# Patient Record
Sex: Male | Born: 1965
Health system: Southern US, Community
[De-identification: ages and names within clinical notes are randomized; demographics above are authoritative.]

## PROBLEM LIST (undated history)

## (undated) DIAGNOSIS — G4730 Sleep apnea, unspecified: Secondary | ICD-10-CM

## (undated) HISTORY — PX: NASAL SINUS SURGERY: SHX719

## (undated) HISTORY — PX: CARPAL TUNNEL RELEASE: SHX101

## (undated) HISTORY — PX: NASAL SEPTUM SURGERY: SHX37

## (undated) HISTORY — PX: LAMINECTOMY: SHX219

## (undated) HISTORY — PX: NECK SURGERY: SHX720

---

## 1999-03-29 ENCOUNTER — Encounter: Admission: RE | Admit: 1999-03-29 | Discharge: 1999-06-27 | Payer: Self-pay | Admitting: Family Medicine

## 1999-05-10 ENCOUNTER — Emergency Department (HOSPITAL_COMMUNITY): Admission: EM | Admit: 1999-05-10 | Discharge: 1999-05-10 | Payer: Self-pay | Admitting: Emergency Medicine

## 1999-05-12 ENCOUNTER — Encounter: Admission: RE | Admit: 1999-05-12 | Discharge: 1999-05-12 | Payer: Self-pay | Admitting: Specialist

## 1999-05-12 ENCOUNTER — Encounter: Payer: Self-pay | Admitting: Specialist

## 1999-05-18 ENCOUNTER — Observation Stay (HOSPITAL_COMMUNITY): Admission: RE | Admit: 1999-05-18 | Discharge: 1999-05-19 | Payer: Self-pay | Admitting: Specialist

## 1999-05-18 ENCOUNTER — Encounter: Payer: Self-pay | Admitting: Specialist

## 2000-03-23 ENCOUNTER — Ambulatory Visit (HOSPITAL_COMMUNITY): Admission: RE | Admit: 2000-03-23 | Discharge: 2000-03-23 | Payer: Self-pay | Admitting: Specialist

## 2000-03-23 ENCOUNTER — Encounter: Payer: Self-pay | Admitting: Specialist

## 2000-12-27 ENCOUNTER — Encounter: Payer: Self-pay | Admitting: Specialist

## 2000-12-27 ENCOUNTER — Encounter: Admission: RE | Admit: 2000-12-27 | Discharge: 2000-12-27 | Payer: Self-pay | Admitting: Specialist

## 2001-02-21 ENCOUNTER — Inpatient Hospital Stay (HOSPITAL_COMMUNITY): Admission: RE | Admit: 2001-02-21 | Discharge: 2001-02-23 | Payer: Self-pay | Admitting: Specialist

## 2001-02-21 ENCOUNTER — Encounter: Payer: Self-pay | Admitting: Specialist

## 2001-07-09 ENCOUNTER — Encounter: Payer: Self-pay | Admitting: Emergency Medicine

## 2001-07-09 ENCOUNTER — Emergency Department (HOSPITAL_COMMUNITY): Admission: EM | Admit: 2001-07-09 | Discharge: 2001-07-09 | Payer: Self-pay | Admitting: Emergency Medicine

## 2006-05-19 ENCOUNTER — Ambulatory Visit (HOSPITAL_COMMUNITY): Admission: RE | Admit: 2006-05-19 | Discharge: 2006-05-20 | Payer: Self-pay | Admitting: Neurosurgery

## 2015-04-22 DIAGNOSIS — E782 Mixed hyperlipidemia: Secondary | ICD-10-CM | POA: Diagnosis not present

## 2015-04-22 DIAGNOSIS — Z0001 Encounter for general adult medical examination with abnormal findings: Secondary | ICD-10-CM | POA: Diagnosis not present

## 2015-04-27 DIAGNOSIS — Z0001 Encounter for general adult medical examination with abnormal findings: Secondary | ICD-10-CM | POA: Diagnosis not present

## 2015-04-27 DIAGNOSIS — Z6834 Body mass index (BMI) 34.0-34.9, adult: Secondary | ICD-10-CM | POA: Diagnosis not present

## 2015-05-29 DIAGNOSIS — M159 Polyosteoarthritis, unspecified: Secondary | ICD-10-CM | POA: Diagnosis not present

## 2015-05-29 DIAGNOSIS — E669 Obesity, unspecified: Secondary | ICD-10-CM | POA: Diagnosis not present

## 2015-06-15 DIAGNOSIS — Z6834 Body mass index (BMI) 34.0-34.9, adult: Secondary | ICD-10-CM | POA: Diagnosis not present

## 2015-06-15 DIAGNOSIS — H6503 Acute serous otitis media, bilateral: Secondary | ICD-10-CM | POA: Diagnosis not present

## 2015-06-15 DIAGNOSIS — J0100 Acute maxillary sinusitis, unspecified: Secondary | ICD-10-CM | POA: Diagnosis not present

## 2015-07-01 DIAGNOSIS — R2232 Localized swelling, mass and lump, left upper limb: Secondary | ICD-10-CM | POA: Diagnosis not present

## 2015-07-01 DIAGNOSIS — G5613 Other lesions of median nerve, bilateral upper limbs: Secondary | ICD-10-CM | POA: Diagnosis not present

## 2015-07-02 DIAGNOSIS — Z0189 Encounter for other specified special examinations: Secondary | ICD-10-CM | POA: Diagnosis not present

## 2015-07-02 DIAGNOSIS — Z0001 Encounter for general adult medical examination with abnormal findings: Secondary | ICD-10-CM | POA: Diagnosis not present

## 2015-07-02 DIAGNOSIS — J40 Bronchitis, not specified as acute or chronic: Secondary | ICD-10-CM | POA: Diagnosis not present

## 2015-07-27 DIAGNOSIS — H401130 Primary open-angle glaucoma, bilateral, stage unspecified: Secondary | ICD-10-CM | POA: Diagnosis not present

## 2015-11-04 DIAGNOSIS — Z01812 Encounter for preprocedural laboratory examination: Secondary | ICD-10-CM | POA: Diagnosis not present

## 2015-11-04 DIAGNOSIS — Z01818 Encounter for other preprocedural examination: Secondary | ICD-10-CM | POA: Diagnosis not present

## 2015-11-04 DIAGNOSIS — G5603 Carpal tunnel syndrome, bilateral upper limbs: Secondary | ICD-10-CM | POA: Diagnosis not present

## 2015-11-05 DIAGNOSIS — G5612 Other lesions of median nerve, left upper limb: Secondary | ICD-10-CM | POA: Diagnosis not present

## 2015-11-05 DIAGNOSIS — G5602 Carpal tunnel syndrome, left upper limb: Secondary | ICD-10-CM | POA: Diagnosis not present

## 2016-01-11 DIAGNOSIS — I2699 Other pulmonary embolism without acute cor pulmonale: Secondary | ICD-10-CM

## 2016-01-11 HISTORY — DX: Other pulmonary embolism without acute cor pulmonale: I26.99

## 2016-01-21 DIAGNOSIS — H401130 Primary open-angle glaucoma, bilateral, stage unspecified: Secondary | ICD-10-CM | POA: Diagnosis not present

## 2016-01-21 DIAGNOSIS — H401131 Primary open-angle glaucoma, bilateral, mild stage: Secondary | ICD-10-CM | POA: Diagnosis not present

## 2016-03-19 ENCOUNTER — Emergency Department (HOSPITAL_COMMUNITY)
Admission: EM | Admit: 2016-03-19 | Discharge: 2016-03-19 | Disposition: A | Discharge status: Home / Self Care | Payer: Federal, State, Local not specified - PPO | Attending: Emergency Medicine | Admitting: Emergency Medicine

## 2016-03-19 ENCOUNTER — Emergency Department (HOSPITAL_COMMUNITY): Payer: Federal, State, Local not specified - PPO

## 2016-03-19 ENCOUNTER — Encounter (HOSPITAL_COMMUNITY): Payer: Self-pay | Admitting: Emergency Medicine

## 2016-03-19 DIAGNOSIS — M545 Low back pain: Secondary | ICD-10-CM | POA: Diagnosis not present

## 2016-03-19 DIAGNOSIS — Z6835 Body mass index (BMI) 35.0-35.9, adult: Secondary | ICD-10-CM | POA: Diagnosis not present

## 2016-03-19 DIAGNOSIS — Z87891 Personal history of nicotine dependence: Secondary | ICD-10-CM | POA: Diagnosis not present

## 2016-03-19 DIAGNOSIS — M5417 Radiculopathy, lumbosacral region: Secondary | ICD-10-CM | POA: Diagnosis not present

## 2016-03-19 DIAGNOSIS — M5442 Lumbago with sciatica, left side: Secondary | ICD-10-CM | POA: Diagnosis not present

## 2016-03-19 DIAGNOSIS — M549 Dorsalgia, unspecified: Secondary | ICD-10-CM

## 2016-03-19 DIAGNOSIS — G834 Cauda equina syndrome: Secondary | ICD-10-CM | POA: Diagnosis not present

## 2016-03-19 LAB — URINALYSIS, ROUTINE W REFLEX MICROSCOPIC
BILIRUBIN URINE: NEGATIVE
Glucose, UA: NEGATIVE mg/dL
Hgb urine dipstick: NEGATIVE
KETONES UR: NEGATIVE mg/dL
LEUKOCYTES UA: NEGATIVE
NITRITE: NEGATIVE
Protein, ur: NEGATIVE mg/dL
SPECIFIC GRAVITY, URINE: 1.013 (ref 1.005–1.030)
pH: 6 (ref 5.0–8.0)

## 2016-03-19 LAB — I-STAT CHEM 8, ED
BUN: 14 mg/dL (ref 6–20)
CALCIUM ION: 1.12 mmol/L — AB (ref 1.15–1.40)
CHLORIDE: 103 mmol/L (ref 101–111)
Creatinine, Ser: 0.9 mg/dL (ref 0.61–1.24)
Glucose, Bld: 90 mg/dL (ref 65–99)
HEMATOCRIT: 46 % (ref 39.0–52.0)
Hemoglobin: 15.6 g/dL (ref 13.0–17.0)
Potassium: 4.4 mmol/L (ref 3.5–5.1)
SODIUM: 138 mmol/L (ref 135–145)
TCO2: 26 mmol/L (ref 0–100)

## 2016-03-19 MED ORDER — METHOCARBAMOL 500 MG PO TABS
500.0000 mg | ORAL_TABLET | Freq: Once | ORAL | Status: AC
  Administered 2016-03-19: 500 mg via ORAL
  Filled 2016-03-19: qty 1

## 2016-03-19 MED ORDER — GADOBENATE DIMEGLUMINE 529 MG/ML IV SOLN
20.0000 mL | Freq: Once | INTRAVENOUS | Status: AC | PRN
  Administered 2016-03-19: 20 mL via INTRAVENOUS

## 2016-03-19 MED ORDER — SODIUM CHLORIDE 0.9 % IV BOLUS (SEPSIS)
1000.0000 mL | Freq: Once | INTRAVENOUS | Status: AC
  Administered 2016-03-19: 1000 mL via INTRAVENOUS

## 2016-03-19 MED ORDER — HYDROMORPHONE HCL 1 MG/ML IJ SOLN
1.0000 mg | Freq: Once | INTRAMUSCULAR | Status: AC
  Administered 2016-03-19: 1 mg via INTRAVENOUS
  Filled 2016-03-19: qty 1

## 2016-03-19 NOTE — ED Notes (Signed)
Patient transported to MRI 

## 2016-03-19 NOTE — ED Triage Notes (Signed)
Patient c/o lower back pain x10 days with intermittent numbness in the left leg and difficulty urinating. Reports he was seen at PCP this morning who referred patient to be evaluated here and get an MRI. Patient ambulatory to triage.

## 2016-03-19 NOTE — ED Provider Notes (Signed)
[facility]-EMERGENCY DEPT Provider Note   CSN: [id number] Arrival date & time: 03/19/16  1323     History   Chief Complaint Chief Complaint  Patient presents with  . Back Pain    HPI Samuel Peterson is a 51 y.o. male.  51 year old male with history of multiple back surgeries presents to the emergency department with about 10 days of progressively worsening midline lumbar back pain seems to radiate to the left side of his back done outside of his left leg and down to his toe. He's had similar symptoms before sciatica flares but usually this was waiting to 3 days. He's also had some increased urinary frequency with decreased urinary output. He has had some constipation but relates this to the narcotic pain medicine he takes an seems that its normal for him. He's had weakness in both legs and some numbness down his left leg. He relayed this information to his primary doctor this morning when he was there for sinus issues and they recommended he come here for further evaluation. He has not had any fevers, chills, dysuria, rectal pain, saddle anesthesia or difficulty walking. No nausea vomiting or diarrhea. He says that he has taken muscle relaxers and narcotic pain medicine without any significant relief. He also states he has used some heat helps some with the lower back pain but not with the other symptoms.      History reviewed. No pertinent past medical history.  There are no active problems to display for this patient.   Past Surgical History:  Procedure Laterality Date  . CARPAL TUNNEL RELEASE    . LAMINECTOMY    . NASAL SEPTUM SURGERY    . NASAL SINUS SURGERY    . NECK SURGERY         Home Medications    Prior to Admission medications   Medication Sig Start Date End Date Taking? Authorizing Provider  acetaminophen (TYLENOL) 500 MG tablet Take 1,500-2,000 mg by mouth every 6 (six) hours as needed (pain).   Yes Historical Provider, MD  ibuprofen (ADVIL,MOTRIN) 200 MG tablet  Take 600-800 mg by mouth every 6 (six) hours as needed (pain).   Yes Historical Provider, MD  latanoprost (XALATAN) 0.005 % ophthalmic solution Place 1 drop into both eyes at bedtime. 01/29/16  Yes Historical Provider, MD    Family History History reviewed. No pertinent family history.  Social History Social History  Substance Use Topics  . Smoking status: Former Games developer  . Smokeless tobacco: Never Used  . Alcohol use Not on file     Allergies   Patient has no known allergies.   Review of Systems Review of Systems  All other systems reviewed and are negative.    Physical Exam Updated Vital Signs BP 122/78 (BP Location: Left Arm)   Pulse 100   Temp 98.1 F (36.7 C) (Oral)   Resp 20   Ht 5\' 9"  (1.753 m)   Wt 248 lb (112.5 kg)   SpO2 95%   BMI 36.62 kg/m   Physical Exam  Constitutional: He is oriented to person, place, and time. He appears well-developed and well-nourished.  HENT:  Head: Normocephalic and atraumatic.  Eyes: Conjunctivae and EOM are normal.  Neck: Normal range of motion.  Cardiovascular: Normal rate.   Pulmonary/Chest: Effort normal. No respiratory distress.  Abdominal: Soft. He exhibits no distension.  Musculoskeletal: Normal range of motion.  Neurological: He is alert and oriented to person, place, and time. No cranial nerve deficit. Coordination normal.  No  altered mental status, able to give full seemingly accurate history.  Face is symmetric, EOM's intact, pupils equal and reactive, vision intact, tongue and uvula midline without deviation Upper and Lower extremity motor 4+/5 (seems related to pain), intact pain perception in distal upper extremities but has decreased sensation to light touch in left lateral leg, 2+ reflexes patella and achilles tendons. Finger to nose normal, heel to shin normal. Walks without assistance or evident ataxia.    Skin: Skin is warm and dry.  Nursing note and vitals reviewed.    ED Treatments / Results   Labs (all labs ordered are listed, but only abnormal results are displayed) Labs Reviewed  I-STAT CHEM 8, ED - Abnormal; Notable for the following:       Result Value   Calcium, Ion 1.12 (*)    All other components within normal limits  URINALYSIS, ROUTINE W REFLEX MICROSCOPIC    EKG  EKG Interpretation None       Radiology Mr Lumbar Spine W Wo Contrast  Result Date: 03/19/2016 CLINICAL DATA:  Back pain. EXAM: MRI LUMBAR SPINE WITHOUT AND WITH CONTRAST TECHNIQUE: Multiplanar and multiecho pulse sequences of the lumbar spine were obtained without and with intravenous contrast. CONTRAST:  20mL MULTIHANCE GADOBENATE DIMEGLUMINE 529 MG/ML IV SOLN COMPARISON:  None. FINDINGS: Segmentation:  Standard. Alignment:  Physiologic. Vertebrae: No fracture, evidence of discitis, or bone lesion. Mild osteoarthritis of bilateral SI joints. Conus medullaris: Extends to the T12 level and appears normal. Paraspinal and other soft tissues: Negative. Disc levels: Disc spaces: Degenerative disc disease with disc height loss at L4-5. Laminectomy at L4-5. T11-12: Mild broad-based disc bulge. No foraminal or central canal stenosis. Mild bilateral facet arthropathy. T12-L1: Mild broad-based disc bulge eccentric towards the right. No evidence of neural foraminal stenosis. No central canal stenosis. L1-L2: No significant disc bulge. No evidence of neural foraminal stenosis. No central canal stenosis. L2-L3: No significant disc bulge. No evidence of neural foraminal stenosis. No central canal stenosis. L3-L4: Broad-based disc bulge with a central disc protrusion. Bilateral lateral recess narrowing. Mild bilateral facet arthropathy. No evidence of neural foraminal stenosis. No central canal stenosis. L4-L5: Broad-based disc bulge. Moderate bilateral facet arthropathy. Mild bilateral foraminal stenosis. No central canal stenosis. L5-S1: No significant disc bulge. No evidence of neural foraminal stenosis. No central canal  stenosis. Mild bilateral facet arthropathy. IMPRESSION: 1. At L3-4 there is a broad-based disc bulge with a central disc protrusion. Bilateral lateral recess narrowing. Mild bilateral facet arthropathy. 2. At L4-5 there is a broad-based disc bulge. Moderate bilateral facet arthropathy. Mild bilateral foraminal stenosis. Electronically Signed   By: Elige Ko   On: 03/19/2016 16:58    Procedures Procedures (including critical care time)  Medications Ordered in ED Medications  HYDROmorphone (DILAUDID) injection 1 mg (1 mg Intravenous Given 03/19/16 1430)  methocarbamol (ROBAXIN) tablet 500 mg (500 mg Oral Given 03/19/16 1430)  sodium chloride 0.9 % bolus 1,000 mL (0 mLs Intravenous Stopped 03/19/16 1740)  gadobenate dimeglumine (MULTIHANCE) injection 20 mL (20 mLs Intravenous Contrast Given 03/19/16 1639)     Initial Impression / Assessment and Plan / ED Course  I have reviewed the triage vital signs and the nursing notes.  Pertinent labs & imaging results that were available during my care of the patient were reviewed by me and considered in my medical decision making (see chart for details).    Likely muscular back pain with a component of sciatica however with the new urinary and neuro symptoms and the  prolonged course of his symptoms there is a higher than normal probability of cord compression, so I'll do an MRI to make sure it is ok. We'll also check for urinary tract infection and also treat him for muscular spasm.  Has some disc protrusion and foraminal narrowing consistent with symptoms. No cord compression. Stable for dc with pcp and surgery follow up.   Final Clinical Impressions(s) / ED Diagnoses   Final diagnoses:  Back pain  Acute left-sided low back pain with left-sided sciatica      Marily MemosJason Jereme Loren, MD 03/19/16 1806

## 2016-04-25 DIAGNOSIS — Z0001 Encounter for general adult medical examination with abnormal findings: Secondary | ICD-10-CM | POA: Diagnosis not present

## 2016-04-27 DIAGNOSIS — Z1211 Encounter for screening for malignant neoplasm of colon: Secondary | ICD-10-CM | POA: Diagnosis not present

## 2016-04-27 DIAGNOSIS — K219 Gastro-esophageal reflux disease without esophagitis: Secondary | ICD-10-CM | POA: Diagnosis not present

## 2016-04-28 DIAGNOSIS — Z23 Encounter for immunization: Secondary | ICD-10-CM | POA: Diagnosis not present

## 2016-04-28 DIAGNOSIS — Z0001 Encounter for general adult medical examination with abnormal findings: Secondary | ICD-10-CM | POA: Diagnosis not present

## 2016-04-28 DIAGNOSIS — Z6834 Body mass index (BMI) 34.0-34.9, adult: Secondary | ICD-10-CM | POA: Diagnosis not present

## 2016-05-02 DIAGNOSIS — R0602 Shortness of breath: Secondary | ICD-10-CM | POA: Diagnosis not present

## 2016-05-02 DIAGNOSIS — R05 Cough: Secondary | ICD-10-CM | POA: Diagnosis not present

## 2016-05-02 DIAGNOSIS — I2699 Other pulmonary embolism without acute cor pulmonale: Secondary | ICD-10-CM | POA: Diagnosis not present

## 2016-05-02 DIAGNOSIS — R509 Fever, unspecified: Secondary | ICD-10-CM | POA: Diagnosis not present

## 2016-05-02 DIAGNOSIS — Z6835 Body mass index (BMI) 35.0-35.9, adult: Secondary | ICD-10-CM | POA: Diagnosis not present

## 2016-05-10 DIAGNOSIS — Z86711 Personal history of pulmonary embolism: Secondary | ICD-10-CM | POA: Diagnosis not present

## 2016-05-10 DIAGNOSIS — K219 Gastro-esophageal reflux disease without esophagitis: Secondary | ICD-10-CM | POA: Diagnosis not present

## 2016-05-10 DIAGNOSIS — E6609 Other obesity due to excess calories: Secondary | ICD-10-CM | POA: Diagnosis not present

## 2016-05-10 DIAGNOSIS — R0602 Shortness of breath: Secondary | ICD-10-CM | POA: Diagnosis not present

## 2016-05-11 DIAGNOSIS — Z86711 Personal history of pulmonary embolism: Secondary | ICD-10-CM | POA: Diagnosis not present

## 2016-05-23 DIAGNOSIS — H401131 Primary open-angle glaucoma, bilateral, mild stage: Secondary | ICD-10-CM | POA: Diagnosis not present

## 2016-06-08 DIAGNOSIS — Z1211 Encounter for screening for malignant neoplasm of colon: Secondary | ICD-10-CM | POA: Diagnosis not present

## 2016-06-08 DIAGNOSIS — Z7901 Long term (current) use of anticoagulants: Secondary | ICD-10-CM | POA: Diagnosis not present

## 2016-06-08 DIAGNOSIS — R635 Abnormal weight gain: Secondary | ICD-10-CM | POA: Diagnosis not present

## 2016-06-10 DIAGNOSIS — E6609 Other obesity due to excess calories: Secondary | ICD-10-CM | POA: Diagnosis not present

## 2016-06-10 DIAGNOSIS — K219 Gastro-esophageal reflux disease without esophagitis: Secondary | ICD-10-CM | POA: Diagnosis not present

## 2016-06-10 DIAGNOSIS — R0602 Shortness of breath: Secondary | ICD-10-CM | POA: Diagnosis not present

## 2016-06-10 DIAGNOSIS — Z86711 Personal history of pulmonary embolism: Secondary | ICD-10-CM | POA: Diagnosis not present

## 2016-06-29 DIAGNOSIS — Z23 Encounter for immunization: Secondary | ICD-10-CM | POA: Diagnosis not present

## 2016-07-01 DIAGNOSIS — I2699 Other pulmonary embolism without acute cor pulmonale: Secondary | ICD-10-CM | POA: Diagnosis not present

## 2016-07-01 DIAGNOSIS — D6862 Lupus anticoagulant syndrome: Secondary | ICD-10-CM | POA: Diagnosis not present

## 2016-07-01 DIAGNOSIS — Z7901 Long term (current) use of anticoagulants: Secondary | ICD-10-CM | POA: Diagnosis not present

## 2016-08-11 DIAGNOSIS — Z7901 Long term (current) use of anticoagulants: Secondary | ICD-10-CM | POA: Diagnosis not present

## 2016-08-11 DIAGNOSIS — D6862 Lupus anticoagulant syndrome: Secondary | ICD-10-CM | POA: Diagnosis not present

## 2016-08-11 DIAGNOSIS — I2699 Other pulmonary embolism without acute cor pulmonale: Secondary | ICD-10-CM | POA: Diagnosis not present

## 2016-09-14 DIAGNOSIS — I2699 Other pulmonary embolism without acute cor pulmonale: Secondary | ICD-10-CM | POA: Diagnosis not present

## 2016-09-16 DIAGNOSIS — Z23 Encounter for immunization: Secondary | ICD-10-CM | POA: Diagnosis not present

## 2016-09-28 DIAGNOSIS — M79632 Pain in left forearm: Secondary | ICD-10-CM | POA: Diagnosis not present

## 2016-09-28 DIAGNOSIS — G5602 Carpal tunnel syndrome, left upper limb: Secondary | ICD-10-CM | POA: Diagnosis not present

## 2016-09-28 DIAGNOSIS — M25522 Pain in left elbow: Secondary | ICD-10-CM | POA: Diagnosis not present

## 2016-09-28 DIAGNOSIS — M25532 Pain in left wrist: Secondary | ICD-10-CM | POA: Diagnosis not present

## 2016-10-20 DIAGNOSIS — Z86711 Personal history of pulmonary embolism: Secondary | ICD-10-CM | POA: Diagnosis not present

## 2016-10-20 DIAGNOSIS — D6862 Lupus anticoagulant syndrome: Secondary | ICD-10-CM | POA: Diagnosis not present

## 2016-10-20 DIAGNOSIS — K219 Gastro-esophageal reflux disease without esophagitis: Secondary | ICD-10-CM | POA: Diagnosis not present

## 2016-10-20 DIAGNOSIS — E782 Mixed hyperlipidemia: Secondary | ICD-10-CM | POA: Diagnosis not present

## 2016-10-27 DIAGNOSIS — E6609 Other obesity due to excess calories: Secondary | ICD-10-CM | POA: Diagnosis not present

## 2016-10-27 DIAGNOSIS — D6862 Lupus anticoagulant syndrome: Secondary | ICD-10-CM | POA: Diagnosis not present

## 2016-10-27 DIAGNOSIS — Z23 Encounter for immunization: Secondary | ICD-10-CM | POA: Diagnosis not present

## 2016-10-27 DIAGNOSIS — K219 Gastro-esophageal reflux disease without esophagitis: Secondary | ICD-10-CM | POA: Diagnosis not present

## 2016-10-27 DIAGNOSIS — Z86711 Personal history of pulmonary embolism: Secondary | ICD-10-CM | POA: Diagnosis not present

## 2016-11-09 DIAGNOSIS — Z7901 Long term (current) use of anticoagulants: Secondary | ICD-10-CM | POA: Diagnosis not present

## 2016-11-09 DIAGNOSIS — Z1211 Encounter for screening for malignant neoplasm of colon: Secondary | ICD-10-CM | POA: Diagnosis not present

## 2016-11-10 DIAGNOSIS — Z86711 Personal history of pulmonary embolism: Secondary | ICD-10-CM | POA: Diagnosis not present

## 2016-11-10 DIAGNOSIS — Z7901 Long term (current) use of anticoagulants: Secondary | ICD-10-CM | POA: Diagnosis not present

## 2016-12-22 DIAGNOSIS — H527 Unspecified disorder of refraction: Secondary | ICD-10-CM | POA: Diagnosis not present

## 2016-12-22 DIAGNOSIS — H401191 Primary open-angle glaucoma, unspecified eye, mild stage: Secondary | ICD-10-CM | POA: Diagnosis not present

## 2017-01-04 DIAGNOSIS — R609 Edema, unspecified: Secondary | ICD-10-CM | POA: Diagnosis not present

## 2017-01-04 DIAGNOSIS — S8002XA Contusion of left knee, initial encounter: Secondary | ICD-10-CM | POA: Diagnosis not present

## 2017-01-04 DIAGNOSIS — M25561 Pain in right knee: Secondary | ICD-10-CM | POA: Diagnosis not present

## 2017-01-04 DIAGNOSIS — M25562 Pain in left knee: Secondary | ICD-10-CM | POA: Diagnosis not present

## 2017-01-04 DIAGNOSIS — S8001XA Contusion of right knee, initial encounter: Secondary | ICD-10-CM | POA: Diagnosis not present

## 2017-01-16 DIAGNOSIS — I517 Cardiomegaly: Secondary | ICD-10-CM | POA: Diagnosis not present

## 2017-01-16 DIAGNOSIS — R609 Edema, unspecified: Secondary | ICD-10-CM | POA: Diagnosis not present

## 2017-01-16 DIAGNOSIS — R635 Abnormal weight gain: Secondary | ICD-10-CM | POA: Diagnosis not present

## 2017-02-21 DIAGNOSIS — M25562 Pain in left knee: Secondary | ICD-10-CM | POA: Diagnosis not present

## 2017-02-21 DIAGNOSIS — Z86718 Personal history of other venous thrombosis and embolism: Secondary | ICD-10-CM | POA: Diagnosis not present

## 2017-02-21 DIAGNOSIS — K219 Gastro-esophageal reflux disease without esophagitis: Secondary | ICD-10-CM | POA: Diagnosis not present

## 2017-02-21 DIAGNOSIS — Z1329 Encounter for screening for other suspected endocrine disorder: Secondary | ICD-10-CM | POA: Diagnosis not present

## 2017-02-21 DIAGNOSIS — M25561 Pain in right knee: Secondary | ICD-10-CM | POA: Diagnosis not present

## 2017-02-21 DIAGNOSIS — Z7901 Long term (current) use of anticoagulants: Secondary | ICD-10-CM | POA: Diagnosis not present

## 2017-02-21 DIAGNOSIS — Z86711 Personal history of pulmonary embolism: Secondary | ICD-10-CM | POA: Diagnosis not present

## 2017-03-01 DIAGNOSIS — Z01818 Encounter for other preprocedural examination: Secondary | ICD-10-CM | POA: Diagnosis not present

## 2017-03-01 DIAGNOSIS — Z1211 Encounter for screening for malignant neoplasm of colon: Secondary | ICD-10-CM | POA: Diagnosis not present

## 2017-03-20 DIAGNOSIS — S83411A Sprain of medial collateral ligament of right knee, initial encounter: Secondary | ICD-10-CM | POA: Diagnosis not present

## 2017-03-20 DIAGNOSIS — M2241 Chondromalacia patellae, right knee: Secondary | ICD-10-CM | POA: Diagnosis not present

## 2017-03-20 DIAGNOSIS — W19XXXA Unspecified fall, initial encounter: Secondary | ICD-10-CM | POA: Diagnosis not present

## 2017-03-20 DIAGNOSIS — S83412A Sprain of medial collateral ligament of left knee, initial encounter: Secondary | ICD-10-CM | POA: Diagnosis not present

## 2017-03-21 DIAGNOSIS — K219 Gastro-esophageal reflux disease without esophagitis: Secondary | ICD-10-CM | POA: Diagnosis not present

## 2017-03-21 DIAGNOSIS — K7581 Nonalcoholic steatohepatitis (NASH): Secondary | ICD-10-CM | POA: Diagnosis not present

## 2017-03-21 DIAGNOSIS — D6862 Lupus anticoagulant syndrome: Secondary | ICD-10-CM | POA: Diagnosis not present

## 2017-03-21 DIAGNOSIS — E782 Mixed hyperlipidemia: Secondary | ICD-10-CM | POA: Diagnosis not present

## 2017-03-21 DIAGNOSIS — R739 Hyperglycemia, unspecified: Secondary | ICD-10-CM | POA: Diagnosis not present

## 2017-03-24 DIAGNOSIS — D6862 Lupus anticoagulant syndrome: Secondary | ICD-10-CM | POA: Diagnosis not present

## 2017-03-24 DIAGNOSIS — H401131 Primary open-angle glaucoma, bilateral, mild stage: Secondary | ICD-10-CM | POA: Diagnosis not present

## 2017-03-24 DIAGNOSIS — Z1389 Encounter for screening for other disorder: Secondary | ICD-10-CM | POA: Diagnosis not present

## 2017-03-24 DIAGNOSIS — H524 Presbyopia: Secondary | ICD-10-CM | POA: Diagnosis not present

## 2017-03-24 DIAGNOSIS — E6609 Other obesity due to excess calories: Secondary | ICD-10-CM | POA: Diagnosis not present

## 2017-03-24 DIAGNOSIS — Z86711 Personal history of pulmonary embolism: Secondary | ICD-10-CM | POA: Diagnosis not present

## 2017-03-24 DIAGNOSIS — K219 Gastro-esophageal reflux disease without esophagitis: Secondary | ICD-10-CM | POA: Diagnosis not present

## 2017-03-29 DIAGNOSIS — K7689 Other specified diseases of liver: Secondary | ICD-10-CM | POA: Diagnosis not present

## 2017-03-29 DIAGNOSIS — R748 Abnormal levels of other serum enzymes: Secondary | ICD-10-CM | POA: Diagnosis not present

## 2017-03-29 DIAGNOSIS — R932 Abnormal findings on diagnostic imaging of liver and biliary tract: Secondary | ICD-10-CM | POA: Diagnosis not present

## 2017-04-03 DIAGNOSIS — S8002XA Contusion of left knee, initial encounter: Secondary | ICD-10-CM | POA: Diagnosis not present

## 2017-04-03 DIAGNOSIS — S83207A Unspecified tear of unspecified meniscus, current injury, left knee, initial encounter: Secondary | ICD-10-CM | POA: Diagnosis not present

## 2017-04-03 DIAGNOSIS — S8001XA Contusion of right knee, initial encounter: Secondary | ICD-10-CM | POA: Diagnosis not present

## 2017-04-04 DIAGNOSIS — Z5181 Encounter for therapeutic drug level monitoring: Secondary | ICD-10-CM | POA: Diagnosis not present

## 2017-04-04 DIAGNOSIS — Z86711 Personal history of pulmonary embolism: Secondary | ICD-10-CM | POA: Diagnosis not present

## 2017-04-04 DIAGNOSIS — Z7901 Long term (current) use of anticoagulants: Secondary | ICD-10-CM | POA: Diagnosis not present

## 2017-04-18 DIAGNOSIS — H53149 Visual discomfort, unspecified: Secondary | ICD-10-CM | POA: Diagnosis not present

## 2017-04-18 DIAGNOSIS — H524 Presbyopia: Secondary | ICD-10-CM | POA: Diagnosis not present

## 2017-04-19 DIAGNOSIS — H401131 Primary open-angle glaucoma, bilateral, mild stage: Secondary | ICD-10-CM | POA: Diagnosis not present

## 2017-04-19 DIAGNOSIS — H1851 Endothelial corneal dystrophy: Secondary | ICD-10-CM | POA: Diagnosis not present

## 2017-04-21 DIAGNOSIS — K5140 Inflammatory polyps of colon without complications: Secondary | ICD-10-CM | POA: Diagnosis not present

## 2017-04-21 DIAGNOSIS — Z1211 Encounter for screening for malignant neoplasm of colon: Secondary | ICD-10-CM | POA: Diagnosis not present

## 2017-04-21 DIAGNOSIS — K5730 Diverticulosis of large intestine without perforation or abscess without bleeding: Secondary | ICD-10-CM | POA: Diagnosis not present

## 2017-04-21 DIAGNOSIS — K635 Polyp of colon: Secondary | ICD-10-CM | POA: Diagnosis not present

## 2017-04-21 DIAGNOSIS — D126 Benign neoplasm of colon, unspecified: Secondary | ICD-10-CM | POA: Diagnosis not present

## 2017-04-24 DIAGNOSIS — W19XXXD Unspecified fall, subsequent encounter: Secondary | ICD-10-CM | POA: Diagnosis not present

## 2017-04-24 DIAGNOSIS — S83242D Other tear of medial meniscus, current injury, left knee, subsequent encounter: Secondary | ICD-10-CM | POA: Diagnosis not present

## 2017-04-24 DIAGNOSIS — M94262 Chondromalacia, left knee: Secondary | ICD-10-CM | POA: Diagnosis not present

## 2017-04-25 DIAGNOSIS — D126 Benign neoplasm of colon, unspecified: Secondary | ICD-10-CM | POA: Diagnosis not present

## 2017-04-25 DIAGNOSIS — K635 Polyp of colon: Secondary | ICD-10-CM | POA: Diagnosis not present

## 2017-04-25 DIAGNOSIS — K5140 Inflammatory polyps of colon without complications: Secondary | ICD-10-CM | POA: Diagnosis not present

## 2017-05-29 DIAGNOSIS — Z01810 Encounter for preprocedural cardiovascular examination: Secondary | ICD-10-CM | POA: Diagnosis not present

## 2017-05-29 DIAGNOSIS — Z01818 Encounter for other preprocedural examination: Secondary | ICD-10-CM | POA: Diagnosis not present

## 2017-05-29 DIAGNOSIS — M25562 Pain in left knee: Secondary | ICD-10-CM | POA: Diagnosis not present

## 2017-05-29 DIAGNOSIS — H524 Presbyopia: Secondary | ICD-10-CM | POA: Diagnosis not present

## 2017-05-29 DIAGNOSIS — Z01812 Encounter for preprocedural laboratory examination: Secondary | ICD-10-CM | POA: Diagnosis not present

## 2017-05-29 DIAGNOSIS — H401131 Primary open-angle glaucoma, bilateral, mild stage: Secondary | ICD-10-CM | POA: Diagnosis not present

## 2017-06-21 DIAGNOSIS — M23352 Other meniscus derangements, posterior horn of lateral meniscus, left knee: Secondary | ICD-10-CM | POA: Diagnosis not present

## 2017-06-21 DIAGNOSIS — S83232A Complex tear of medial meniscus, current injury, left knee, initial encounter: Secondary | ICD-10-CM | POA: Diagnosis not present

## 2017-06-21 DIAGNOSIS — W19XXXA Unspecified fall, initial encounter: Secondary | ICD-10-CM | POA: Diagnosis not present

## 2017-06-21 DIAGNOSIS — M2242 Chondromalacia patellae, left knee: Secondary | ICD-10-CM | POA: Diagnosis not present

## 2017-06-23 DIAGNOSIS — K219 Gastro-esophageal reflux disease without esophagitis: Secondary | ICD-10-CM | POA: Diagnosis not present

## 2017-06-23 DIAGNOSIS — Z0001 Encounter for general adult medical examination with abnormal findings: Secondary | ICD-10-CM | POA: Diagnosis not present

## 2017-06-23 DIAGNOSIS — E6609 Other obesity due to excess calories: Secondary | ICD-10-CM | POA: Diagnosis not present

## 2017-06-23 DIAGNOSIS — Z86711 Personal history of pulmonary embolism: Secondary | ICD-10-CM | POA: Diagnosis not present

## 2017-06-23 DIAGNOSIS — D6862 Lupus anticoagulant syndrome: Secondary | ICD-10-CM | POA: Diagnosis not present

## 2017-07-03 DIAGNOSIS — H524 Presbyopia: Secondary | ICD-10-CM | POA: Diagnosis not present

## 2017-07-03 DIAGNOSIS — H53149 Visual discomfort, unspecified: Secondary | ICD-10-CM | POA: Diagnosis not present

## 2017-08-03 DIAGNOSIS — M25562 Pain in left knee: Secondary | ICD-10-CM | POA: Diagnosis not present

## 2017-08-08 DIAGNOSIS — M25562 Pain in left knee: Secondary | ICD-10-CM | POA: Diagnosis not present

## 2017-08-09 DIAGNOSIS — M25562 Pain in left knee: Secondary | ICD-10-CM | POA: Diagnosis not present

## 2017-08-11 DIAGNOSIS — M25562 Pain in left knee: Secondary | ICD-10-CM | POA: Diagnosis not present

## 2017-08-21 DIAGNOSIS — M25562 Pain in left knee: Secondary | ICD-10-CM | POA: Diagnosis not present

## 2017-08-22 DIAGNOSIS — M25562 Pain in left knee: Secondary | ICD-10-CM | POA: Diagnosis not present

## 2017-08-25 DIAGNOSIS — M25562 Pain in left knee: Secondary | ICD-10-CM | POA: Diagnosis not present

## 2017-08-29 DIAGNOSIS — M25562 Pain in left knee: Secondary | ICD-10-CM | POA: Diagnosis not present

## 2017-08-31 DIAGNOSIS — M25562 Pain in left knee: Secondary | ICD-10-CM | POA: Diagnosis not present

## 2017-09-06 DIAGNOSIS — M25562 Pain in left knee: Secondary | ICD-10-CM | POA: Diagnosis not present

## 2017-09-07 DIAGNOSIS — M25562 Pain in left knee: Secondary | ICD-10-CM | POA: Diagnosis not present

## 2017-09-14 DIAGNOSIS — M25562 Pain in left knee: Secondary | ICD-10-CM | POA: Diagnosis not present

## 2017-09-15 DIAGNOSIS — M25562 Pain in left knee: Secondary | ICD-10-CM | POA: Diagnosis not present

## 2017-09-19 DIAGNOSIS — M25562 Pain in left knee: Secondary | ICD-10-CM | POA: Diagnosis not present

## 2017-09-21 DIAGNOSIS — M25562 Pain in left knee: Secondary | ICD-10-CM | POA: Diagnosis not present

## 2017-10-23 DIAGNOSIS — Z23 Encounter for immunization: Secondary | ICD-10-CM | POA: Diagnosis not present

## 2017-11-08 DIAGNOSIS — R1110 Vomiting, unspecified: Secondary | ICD-10-CM | POA: Diagnosis not present

## 2017-11-08 DIAGNOSIS — R197 Diarrhea, unspecified: Secondary | ICD-10-CM | POA: Diagnosis not present

## 2017-11-08 DIAGNOSIS — Z6837 Body mass index (BMI) 37.0-37.9, adult: Secondary | ICD-10-CM | POA: Diagnosis not present

## 2017-11-28 DIAGNOSIS — J329 Chronic sinusitis, unspecified: Secondary | ICD-10-CM | POA: Diagnosis not present

## 2017-11-28 DIAGNOSIS — J069 Acute upper respiratory infection, unspecified: Secondary | ICD-10-CM | POA: Diagnosis not present

## 2017-11-28 DIAGNOSIS — H401131 Primary open-angle glaucoma, bilateral, mild stage: Secondary | ICD-10-CM | POA: Diagnosis not present

## 2017-11-28 DIAGNOSIS — H04123 Dry eye syndrome of bilateral lacrimal glands: Secondary | ICD-10-CM | POA: Diagnosis not present

## 2017-12-12 DIAGNOSIS — D6862 Lupus anticoagulant syndrome: Secondary | ICD-10-CM | POA: Diagnosis not present

## 2017-12-12 DIAGNOSIS — K7581 Nonalcoholic steatohepatitis (NASH): Secondary | ICD-10-CM | POA: Diagnosis not present

## 2017-12-12 DIAGNOSIS — E782 Mixed hyperlipidemia: Secondary | ICD-10-CM | POA: Diagnosis not present

## 2017-12-12 DIAGNOSIS — R739 Hyperglycemia, unspecified: Secondary | ICD-10-CM | POA: Diagnosis not present

## 2017-12-18 DIAGNOSIS — Z131 Encounter for screening for diabetes mellitus: Secondary | ICD-10-CM | POA: Diagnosis not present

## 2017-12-18 DIAGNOSIS — M25562 Pain in left knee: Secondary | ICD-10-CM | POA: Diagnosis not present

## 2017-12-18 DIAGNOSIS — Z1321 Encounter for screening for nutritional disorder: Secondary | ICD-10-CM | POA: Diagnosis not present

## 2017-12-18 DIAGNOSIS — Z0000 Encounter for general adult medical examination without abnormal findings: Secondary | ICD-10-CM | POA: Diagnosis not present

## 2017-12-18 DIAGNOSIS — Z125 Encounter for screening for malignant neoplasm of prostate: Secondary | ICD-10-CM | POA: Diagnosis not present

## 2017-12-18 DIAGNOSIS — R0600 Dyspnea, unspecified: Secondary | ICD-10-CM | POA: Diagnosis not present

## 2017-12-18 DIAGNOSIS — Z86718 Personal history of other venous thrombosis and embolism: Secondary | ICD-10-CM | POA: Diagnosis not present

## 2017-12-18 DIAGNOSIS — R748 Abnormal levels of other serum enzymes: Secondary | ICD-10-CM | POA: Diagnosis not present

## 2017-12-18 DIAGNOSIS — M25561 Pain in right knee: Secondary | ICD-10-CM | POA: Diagnosis not present

## 2017-12-18 DIAGNOSIS — Z86711 Personal history of pulmonary embolism: Secondary | ICD-10-CM | POA: Diagnosis not present

## 2017-12-18 DIAGNOSIS — M25531 Pain in right wrist: Secondary | ICD-10-CM | POA: Diagnosis not present

## 2017-12-21 DIAGNOSIS — D6862 Lupus anticoagulant syndrome: Secondary | ICD-10-CM | POA: Diagnosis not present

## 2017-12-21 DIAGNOSIS — K219 Gastro-esophageal reflux disease without esophagitis: Secondary | ICD-10-CM | POA: Diagnosis not present

## 2017-12-21 DIAGNOSIS — Z86711 Personal history of pulmonary embolism: Secondary | ICD-10-CM | POA: Diagnosis not present

## 2017-12-29 DIAGNOSIS — Z7901 Long term (current) use of anticoagulants: Secondary | ICD-10-CM | POA: Diagnosis not present

## 2017-12-29 DIAGNOSIS — R0600 Dyspnea, unspecified: Secondary | ICD-10-CM | POA: Diagnosis not present

## 2017-12-29 DIAGNOSIS — K219 Gastro-esophageal reflux disease without esophagitis: Secondary | ICD-10-CM | POA: Diagnosis not present

## 2017-12-29 DIAGNOSIS — Z86711 Personal history of pulmonary embolism: Secondary | ICD-10-CM | POA: Diagnosis not present

## 2018-01-04 DIAGNOSIS — Z86711 Personal history of pulmonary embolism: Secondary | ICD-10-CM | POA: Diagnosis not present

## 2018-01-04 DIAGNOSIS — Z09 Encounter for follow-up examination after completed treatment for conditions other than malignant neoplasm: Secondary | ICD-10-CM | POA: Diagnosis not present

## 2018-01-04 DIAGNOSIS — R0602 Shortness of breath: Secondary | ICD-10-CM | POA: Diagnosis not present

## 2018-01-04 DIAGNOSIS — D6862 Lupus anticoagulant syndrome: Secondary | ICD-10-CM | POA: Diagnosis not present

## 2018-01-19 DIAGNOSIS — I340 Nonrheumatic mitral (valve) insufficiency: Secondary | ICD-10-CM | POA: Diagnosis not present

## 2018-01-26 DIAGNOSIS — L304 Erythema intertrigo: Secondary | ICD-10-CM | POA: Diagnosis not present

## 2018-01-26 DIAGNOSIS — R0600 Dyspnea, unspecified: Secondary | ICD-10-CM | POA: Diagnosis not present

## 2018-01-26 DIAGNOSIS — Z6837 Body mass index (BMI) 37.0-37.9, adult: Secondary | ICD-10-CM | POA: Diagnosis not present

## 2018-02-27 DIAGNOSIS — K219 Gastro-esophageal reflux disease without esophagitis: Secondary | ICD-10-CM | POA: Diagnosis not present

## 2018-02-27 DIAGNOSIS — R0600 Dyspnea, unspecified: Secondary | ICD-10-CM | POA: Diagnosis not present

## 2018-02-27 DIAGNOSIS — E669 Obesity, unspecified: Secondary | ICD-10-CM | POA: Diagnosis not present

## 2018-02-27 DIAGNOSIS — Z86711 Personal history of pulmonary embolism: Secondary | ICD-10-CM | POA: Diagnosis not present

## 2018-04-18 IMAGING — MR MR LUMBAR SPINE WO/W CM
4 of 7 series · 18 of 48 positions shown · IV contrast (multihance)
Comparison: None.

CLINICAL DATA: Back pain.

EXAM:
MRI LUMBAR SPINE WITHOUT AND WITH CONTRAST
TECHNIQUE: Multiplanar and multiecho pulse sequences of the lumbar spine were
obtained without and with intravenous contrast.
CONTRAST:  20mL MULTIHANCE GADOBENATE DIMEGLUMINE 529 MG/ML IV SOLN

[Series 3: T1 · sagittal · 4.0mm · 0.51mm/px · 3 of 15 slices shown (1 of 2)]
[im 1/15]
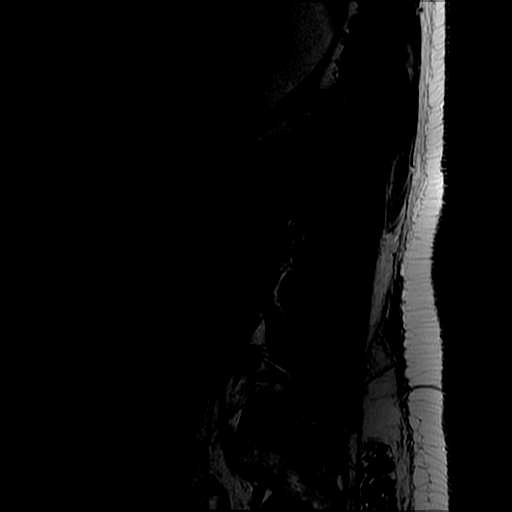
[im 8/15]
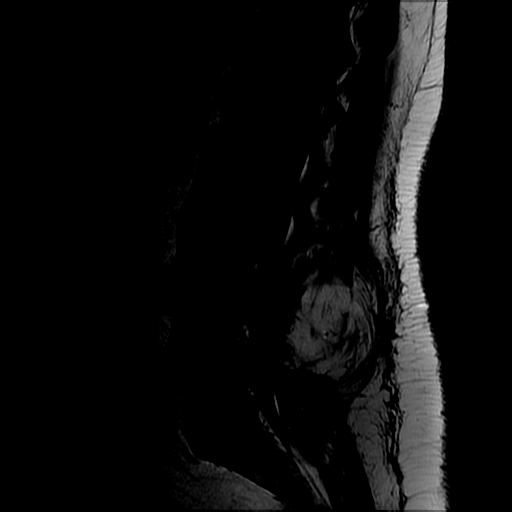
[im 15/15]
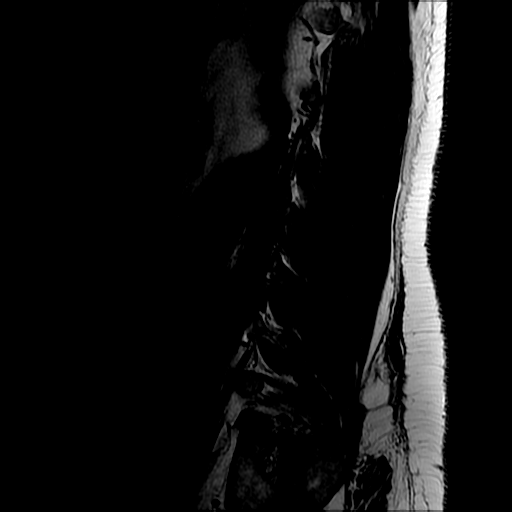

[Series 5: T2 · axial · 4.0mm · 0.41mm/px · z∈[-55,+142]mm · 9 of 43 slices shown]
[im 1/43]
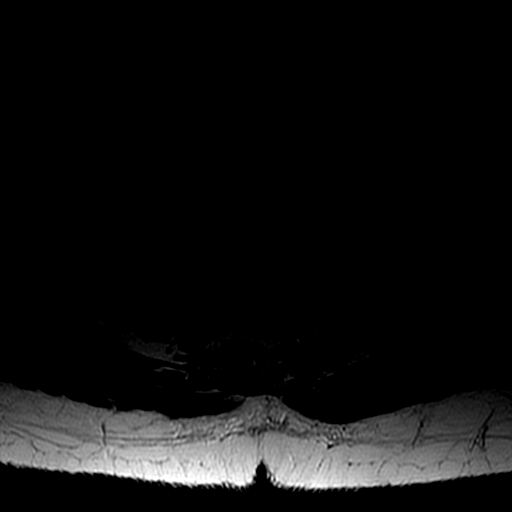
[im 5/43]
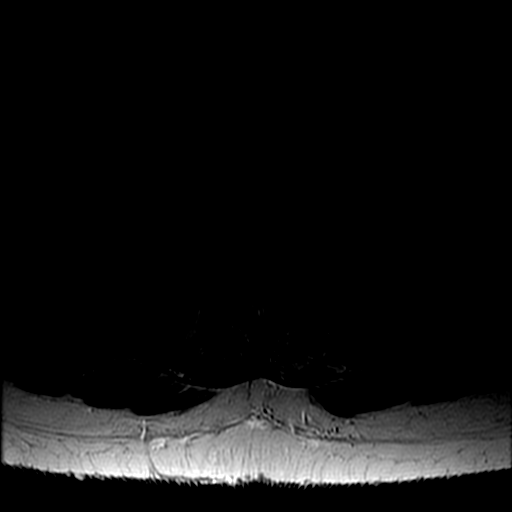
[im 9/43]
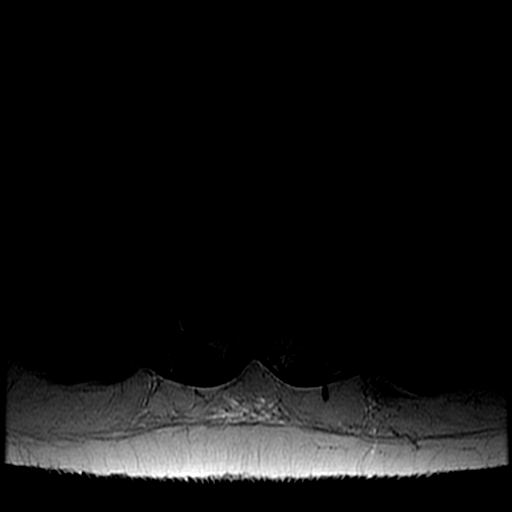
[im 13/43]
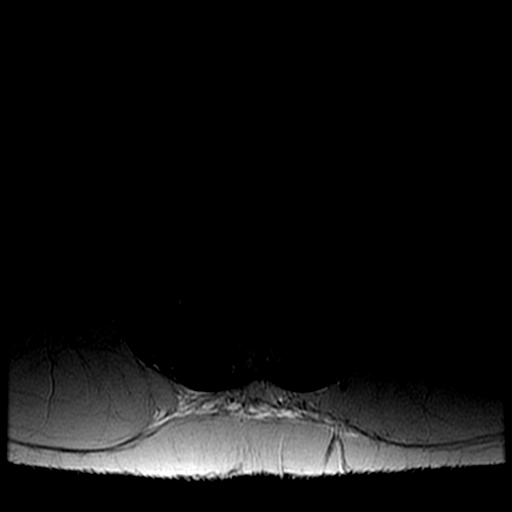
[im 17/43]
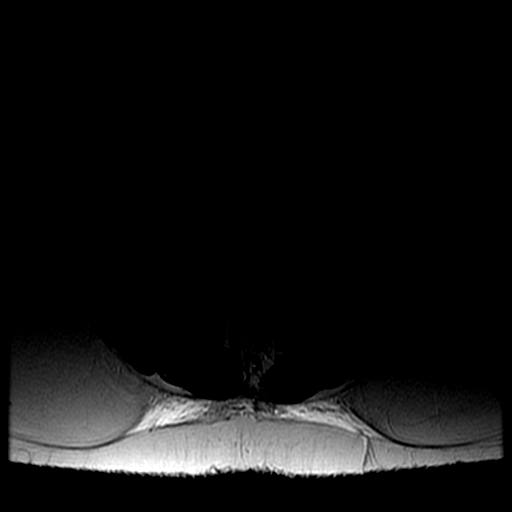
[im 22/43]
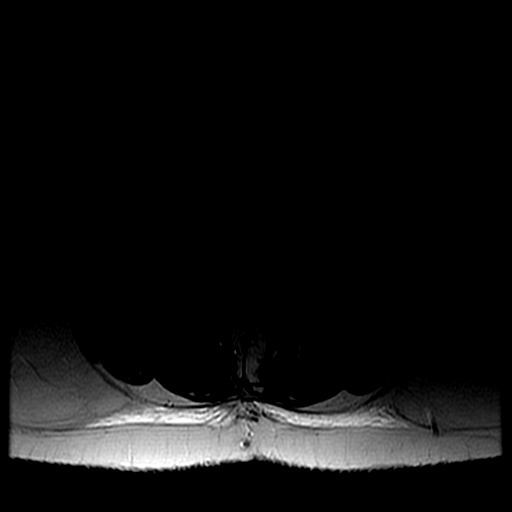
[im 26/43]
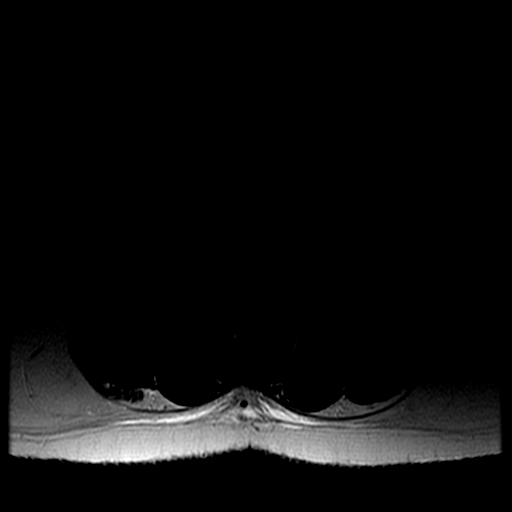
[im 30/43]
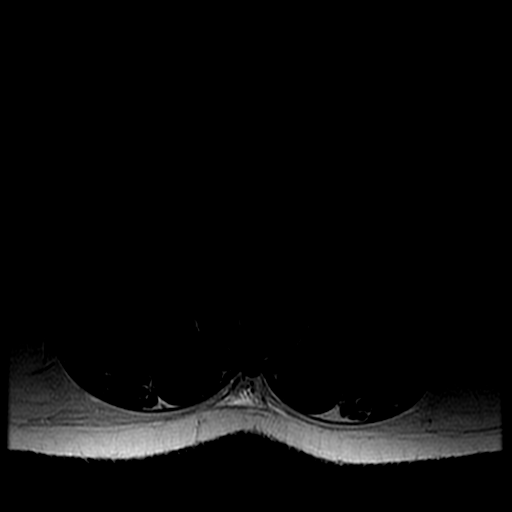
[im 38/43]
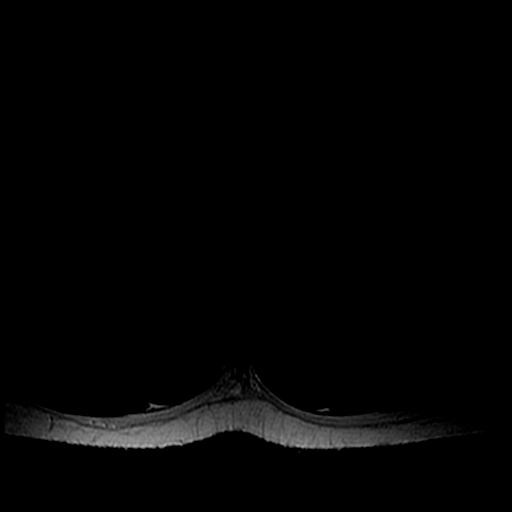

[Series 6: T1 · axial · 4.0mm · 0.41mm/px · z∈[-35,+142]mm · 3 of 43 slices shown (2 of 2)]
[im 5/43]
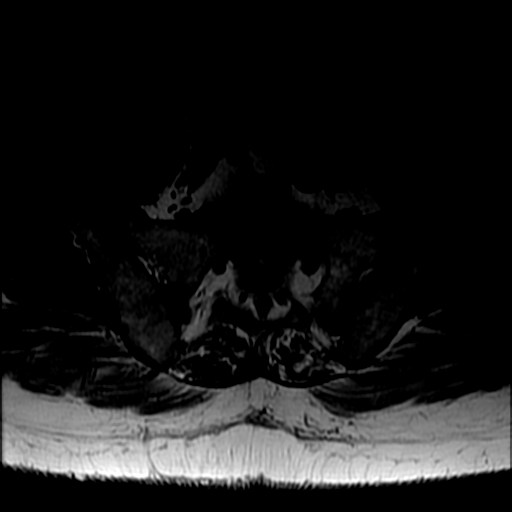
[im 22/43]
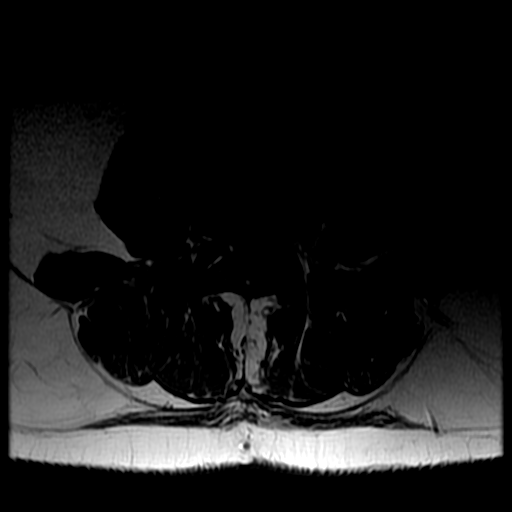
[im 38/43]
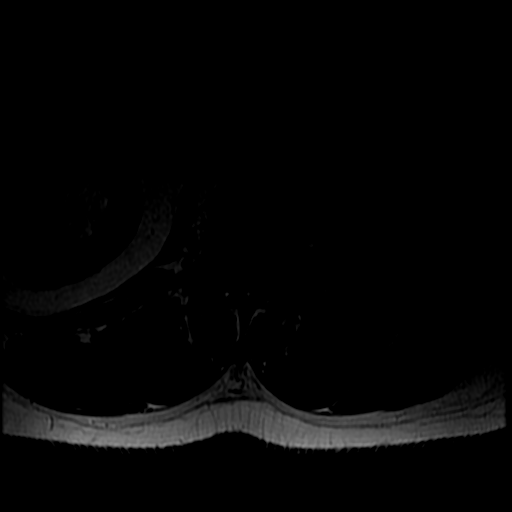

[Series 7: T2 post-contrast · sagittal · 4.0mm · 0.51mm/px · 3 of 15 slices shown]
[im 1/15]
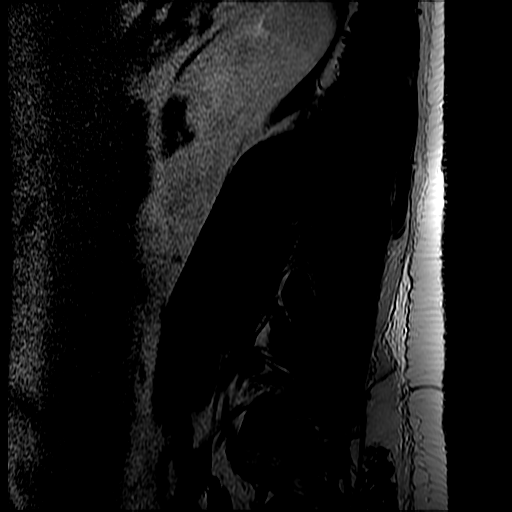
[im 10/15]
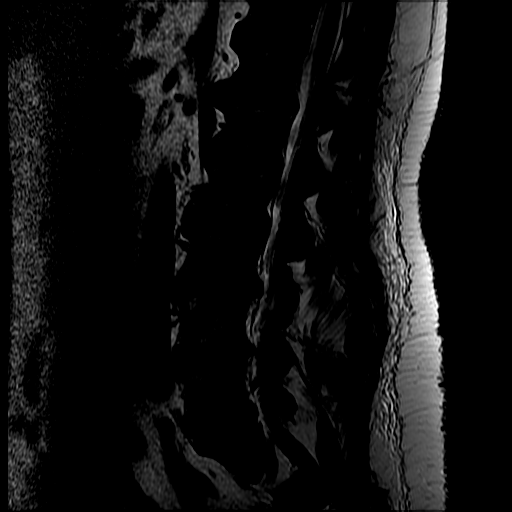
[im 15/15]
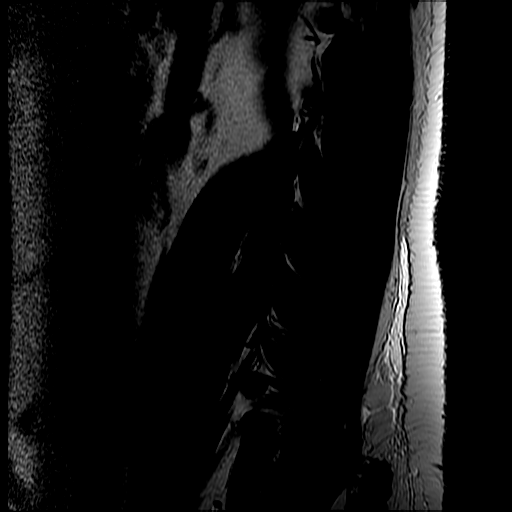

[18 of 48 positions shown; findings below may reference images not displayed]

FINDINGS: Segmentation:  Standard.

Alignment:  Physiologic.

Vertebrae: No fracture, evidence of discitis, or bone lesion. Mild
osteoarthritis of bilateral SI joints.

Conus medullaris: Extends to the T12 level and appears normal.

Paraspinal and other soft tissues: Negative.

Disc levels:

Disc spaces: Degenerative disc disease with disc height loss at
L4-5. Laminectomy at L4-5.

T11-12: Mild broad-based disc bulge. No foraminal or central canal
stenosis. Mild bilateral facet arthropathy.

T12-L1: Mild broad-based disc bulge eccentric towards the right. No
evidence of neural foraminal stenosis. No central canal stenosis.

L1-L2: No significant disc bulge. No evidence of neural foraminal
stenosis. No central canal stenosis.

L2-L3: No significant disc bulge. No evidence of neural foraminal
stenosis. No central canal stenosis.

L3-L4: Broad-based disc bulge with a central disc protrusion.
Bilateral lateral recess narrowing. Mild bilateral facet
arthropathy. No evidence of neural foraminal stenosis. No central
canal stenosis.

L4-L5: Broad-based disc bulge. Moderate bilateral facet arthropathy.
Mild bilateral foraminal stenosis. No central canal stenosis.

L5-S1: No significant disc bulge. No evidence of neural foraminal
stenosis. No central canal stenosis. Mild bilateral facet
arthropathy.
IMPRESSION: 1. At L3-4 there is a broad-based disc bulge with a central disc
protrusion. Bilateral lateral recess narrowing. Mild bilateral facet
arthropathy.
2. At L4-5 there is a broad-based disc bulge. Moderate bilateral
facet arthropathy. Mild bilateral foraminal stenosis.

## 2018-05-30 DIAGNOSIS — K219 Gastro-esophageal reflux disease without esophagitis: Secondary | ICD-10-CM | POA: Diagnosis not present

## 2018-05-30 DIAGNOSIS — R05 Cough: Secondary | ICD-10-CM | POA: Diagnosis not present

## 2018-05-30 DIAGNOSIS — Z6837 Body mass index (BMI) 37.0-37.9, adult: Secondary | ICD-10-CM | POA: Diagnosis not present

## 2018-06-19 DIAGNOSIS — Z0000 Encounter for general adult medical examination without abnormal findings: Secondary | ICD-10-CM | POA: Diagnosis not present

## 2018-06-26 DIAGNOSIS — K219 Gastro-esophageal reflux disease without esophagitis: Secondary | ICD-10-CM | POA: Diagnosis not present

## 2018-06-26 DIAGNOSIS — D6862 Lupus anticoagulant syndrome: Secondary | ICD-10-CM | POA: Diagnosis not present

## 2018-06-26 DIAGNOSIS — Z86711 Personal history of pulmonary embolism: Secondary | ICD-10-CM | POA: Diagnosis not present

## 2018-06-26 DIAGNOSIS — Z0001 Encounter for general adult medical examination with abnormal findings: Secondary | ICD-10-CM | POA: Diagnosis not present

## 2018-07-02 DIAGNOSIS — M7732 Calcaneal spur, left foot: Secondary | ICD-10-CM | POA: Diagnosis not present

## 2018-07-02 DIAGNOSIS — M722 Plantar fascial fibromatosis: Secondary | ICD-10-CM | POA: Diagnosis not present

## 2018-07-02 DIAGNOSIS — M79672 Pain in left foot: Secondary | ICD-10-CM | POA: Diagnosis not present

## 2018-07-02 DIAGNOSIS — M79671 Pain in right foot: Secondary | ICD-10-CM | POA: Diagnosis not present

## 2018-07-02 DIAGNOSIS — M7731 Calcaneal spur, right foot: Secondary | ICD-10-CM | POA: Diagnosis not present

## 2018-07-06 DIAGNOSIS — H2513 Age-related nuclear cataract, bilateral: Secondary | ICD-10-CM | POA: Diagnosis not present

## 2018-07-19 DIAGNOSIS — Z86718 Personal history of other venous thrombosis and embolism: Secondary | ICD-10-CM | POA: Diagnosis not present

## 2018-07-19 DIAGNOSIS — M47812 Spondylosis without myelopathy or radiculopathy, cervical region: Secondary | ICD-10-CM | POA: Diagnosis not present

## 2018-07-19 DIAGNOSIS — M47816 Spondylosis without myelopathy or radiculopathy, lumbar region: Secondary | ICD-10-CM | POA: Diagnosis not present

## 2018-07-19 DIAGNOSIS — M25562 Pain in left knee: Secondary | ICD-10-CM | POA: Diagnosis not present

## 2018-07-20 DIAGNOSIS — R7989 Other specified abnormal findings of blood chemistry: Secondary | ICD-10-CM | POA: Diagnosis not present

## 2018-07-20 DIAGNOSIS — G5601 Carpal tunnel syndrome, right upper limb: Secondary | ICD-10-CM | POA: Diagnosis not present

## 2018-07-20 DIAGNOSIS — M1990 Unspecified osteoarthritis, unspecified site: Secondary | ICD-10-CM | POA: Diagnosis not present

## 2018-07-20 DIAGNOSIS — R0600 Dyspnea, unspecified: Secondary | ICD-10-CM | POA: Diagnosis not present

## 2018-07-20 DIAGNOSIS — R945 Abnormal results of liver function studies: Secondary | ICD-10-CM | POA: Diagnosis not present

## 2018-07-23 DIAGNOSIS — M7731 Calcaneal spur, right foot: Secondary | ICD-10-CM | POA: Diagnosis not present

## 2018-07-23 DIAGNOSIS — M79672 Pain in left foot: Secondary | ICD-10-CM | POA: Diagnosis not present

## 2018-07-23 DIAGNOSIS — M79671 Pain in right foot: Secondary | ICD-10-CM | POA: Diagnosis not present

## 2018-07-23 DIAGNOSIS — M7732 Calcaneal spur, left foot: Secondary | ICD-10-CM | POA: Diagnosis not present

## 2018-07-23 DIAGNOSIS — M722 Plantar fascial fibromatosis: Secondary | ICD-10-CM | POA: Diagnosis not present

## 2018-07-25 DIAGNOSIS — Z1211 Encounter for screening for malignant neoplasm of colon: Secondary | ICD-10-CM | POA: Diagnosis not present

## 2018-08-09 DIAGNOSIS — Z86010 Personal history of colonic polyps: Secondary | ICD-10-CM | POA: Diagnosis not present

## 2018-08-09 DIAGNOSIS — R748 Abnormal levels of other serum enzymes: Secondary | ICD-10-CM | POA: Diagnosis not present

## 2018-08-09 DIAGNOSIS — Z7901 Long term (current) use of anticoagulants: Secondary | ICD-10-CM | POA: Diagnosis not present

## 2018-08-09 DIAGNOSIS — R195 Other fecal abnormalities: Secondary | ICD-10-CM | POA: Diagnosis not present

## 2018-08-13 DIAGNOSIS — M722 Plantar fascial fibromatosis: Secondary | ICD-10-CM | POA: Diagnosis not present

## 2018-08-13 DIAGNOSIS — M7732 Calcaneal spur, left foot: Secondary | ICD-10-CM | POA: Diagnosis not present

## 2018-08-13 DIAGNOSIS — M79672 Pain in left foot: Secondary | ICD-10-CM | POA: Diagnosis not present

## 2018-08-13 DIAGNOSIS — M7731 Calcaneal spur, right foot: Secondary | ICD-10-CM | POA: Diagnosis not present

## 2018-08-13 DIAGNOSIS — M79671 Pain in right foot: Secondary | ICD-10-CM | POA: Diagnosis not present

## 2018-08-17 DIAGNOSIS — E669 Obesity, unspecified: Secondary | ICD-10-CM | POA: Diagnosis not present

## 2018-08-17 DIAGNOSIS — K219 Gastro-esophageal reflux disease without esophagitis: Secondary | ICD-10-CM | POA: Diagnosis not present

## 2018-08-17 DIAGNOSIS — R0609 Other forms of dyspnea: Secondary | ICD-10-CM | POA: Diagnosis not present

## 2018-08-17 DIAGNOSIS — D6859 Other primary thrombophilia: Secondary | ICD-10-CM | POA: Diagnosis not present

## 2018-09-04 DIAGNOSIS — G4733 Obstructive sleep apnea (adult) (pediatric): Secondary | ICD-10-CM | POA: Diagnosis not present

## 2018-09-19 DIAGNOSIS — H53149 Visual discomfort, unspecified: Secondary | ICD-10-CM | POA: Diagnosis not present

## 2018-09-19 DIAGNOSIS — H5213 Myopia, bilateral: Secondary | ICD-10-CM | POA: Diagnosis not present

## 2018-10-05 DIAGNOSIS — Z86010 Personal history of colonic polyps: Secondary | ICD-10-CM | POA: Diagnosis not present

## 2018-10-05 DIAGNOSIS — Z7901 Long term (current) use of anticoagulants: Secondary | ICD-10-CM | POA: Diagnosis not present

## 2018-10-05 DIAGNOSIS — Z86711 Personal history of pulmonary embolism: Secondary | ICD-10-CM | POA: Diagnosis not present

## 2018-10-05 DIAGNOSIS — K625 Hemorrhage of anus and rectum: Secondary | ICD-10-CM | POA: Diagnosis not present

## 2018-10-19 DIAGNOSIS — Z23 Encounter for immunization: Secondary | ICD-10-CM | POA: Diagnosis not present

## 2018-11-03 DIAGNOSIS — Z1159 Encounter for screening for other viral diseases: Secondary | ICD-10-CM | POA: Diagnosis not present

## 2018-11-03 DIAGNOSIS — Z0184 Encounter for antibody response examination: Secondary | ICD-10-CM | POA: Diagnosis not present

## 2018-11-03 DIAGNOSIS — Z01812 Encounter for preprocedural laboratory examination: Secondary | ICD-10-CM | POA: Diagnosis not present

## 2018-11-06 DIAGNOSIS — R195 Other fecal abnormalities: Secondary | ICD-10-CM | POA: Diagnosis not present

## 2018-11-06 DIAGNOSIS — K602 Anal fissure, unspecified: Secondary | ICD-10-CM | POA: Diagnosis not present

## 2018-11-06 DIAGNOSIS — R7401 Elevation of levels of liver transaminase levels: Secondary | ICD-10-CM | POA: Diagnosis not present

## 2018-11-06 DIAGNOSIS — D123 Benign neoplasm of transverse colon: Secondary | ICD-10-CM | POA: Diagnosis not present

## 2018-11-06 DIAGNOSIS — Z86010 Personal history of colonic polyps: Secondary | ICD-10-CM | POA: Diagnosis not present

## 2018-11-06 DIAGNOSIS — G4733 Obstructive sleep apnea (adult) (pediatric): Secondary | ICD-10-CM | POA: Diagnosis not present

## 2018-11-15 DIAGNOSIS — G4733 Obstructive sleep apnea (adult) (pediatric): Secondary | ICD-10-CM | POA: Diagnosis not present

## 2018-12-03 DIAGNOSIS — G4733 Obstructive sleep apnea (adult) (pediatric): Secondary | ICD-10-CM | POA: Diagnosis not present

## 2018-12-17 DIAGNOSIS — G4733 Obstructive sleep apnea (adult) (pediatric): Secondary | ICD-10-CM | POA: Diagnosis not present

## 2018-12-20 DIAGNOSIS — K219 Gastro-esophageal reflux disease without esophagitis: Secondary | ICD-10-CM | POA: Diagnosis not present

## 2018-12-20 DIAGNOSIS — K760 Fatty (change of) liver, not elsewhere classified: Secondary | ICD-10-CM | POA: Diagnosis not present

## 2018-12-20 DIAGNOSIS — R739 Hyperglycemia, unspecified: Secondary | ICD-10-CM | POA: Diagnosis not present

## 2018-12-20 DIAGNOSIS — E782 Mixed hyperlipidemia: Secondary | ICD-10-CM | POA: Diagnosis not present

## 2018-12-24 DIAGNOSIS — K219 Gastro-esophageal reflux disease without esophagitis: Secondary | ICD-10-CM | POA: Diagnosis not present

## 2018-12-24 DIAGNOSIS — D6862 Lupus anticoagulant syndrome: Secondary | ICD-10-CM | POA: Diagnosis not present

## 2018-12-24 DIAGNOSIS — Z86711 Personal history of pulmonary embolism: Secondary | ICD-10-CM | POA: Diagnosis not present

## 2019-01-01 DIAGNOSIS — H401131 Primary open-angle glaucoma, bilateral, mild stage: Secondary | ICD-10-CM | POA: Diagnosis not present

## 2019-02-15 DIAGNOSIS — K219 Gastro-esophageal reflux disease without esophagitis: Secondary | ICD-10-CM | POA: Diagnosis not present

## 2019-02-15 DIAGNOSIS — E669 Obesity, unspecified: Secondary | ICD-10-CM | POA: Diagnosis not present

## 2019-02-15 DIAGNOSIS — G4733 Obstructive sleep apnea (adult) (pediatric): Secondary | ICD-10-CM | POA: Diagnosis not present

## 2019-02-15 DIAGNOSIS — R0609 Other forms of dyspnea: Secondary | ICD-10-CM | POA: Diagnosis not present

## 2019-04-11 DIAGNOSIS — M47816 Spondylosis without myelopathy or radiculopathy, lumbar region: Secondary | ICD-10-CM | POA: Diagnosis not present

## 2019-04-11 DIAGNOSIS — M47812 Spondylosis without myelopathy or radiculopathy, cervical region: Secondary | ICD-10-CM | POA: Diagnosis not present

## 2019-04-11 DIAGNOSIS — G629 Polyneuropathy, unspecified: Secondary | ICD-10-CM | POA: Diagnosis not present

## 2019-04-11 DIAGNOSIS — M62830 Muscle spasm of back: Secondary | ICD-10-CM | POA: Diagnosis not present

## 2019-05-15 DIAGNOSIS — R7989 Other specified abnormal findings of blood chemistry: Secondary | ICD-10-CM | POA: Diagnosis not present

## 2019-06-24 DIAGNOSIS — Z0001 Encounter for general adult medical examination with abnormal findings: Secondary | ICD-10-CM | POA: Diagnosis not present

## 2019-06-28 DIAGNOSIS — H527 Unspecified disorder of refraction: Secondary | ICD-10-CM | POA: Diagnosis not present

## 2019-06-28 DIAGNOSIS — H401131 Primary open-angle glaucoma, bilateral, mild stage: Secondary | ICD-10-CM | POA: Diagnosis not present

## 2019-06-28 DIAGNOSIS — H04123 Dry eye syndrome of bilateral lacrimal glands: Secondary | ICD-10-CM | POA: Diagnosis not present

## 2019-07-01 DIAGNOSIS — D369 Benign neoplasm, unspecified site: Secondary | ICD-10-CM | POA: Diagnosis not present

## 2019-07-01 DIAGNOSIS — Z86711 Personal history of pulmonary embolism: Secondary | ICD-10-CM | POA: Diagnosis not present

## 2019-07-01 DIAGNOSIS — Z0001 Encounter for general adult medical examination with abnormal findings: Secondary | ICD-10-CM | POA: Diagnosis not present

## 2019-07-01 DIAGNOSIS — D6862 Lupus anticoagulant syndrome: Secondary | ICD-10-CM | POA: Diagnosis not present

## 2019-07-01 DIAGNOSIS — K219 Gastro-esophageal reflux disease without esophagitis: Secondary | ICD-10-CM | POA: Diagnosis not present

## 2019-09-05 DIAGNOSIS — R509 Fever, unspecified: Secondary | ICD-10-CM | POA: Diagnosis not present

## 2019-09-05 DIAGNOSIS — Z20822 Contact with and (suspected) exposure to covid-19: Secondary | ICD-10-CM | POA: Diagnosis not present

## 2019-09-05 DIAGNOSIS — U071 COVID-19: Secondary | ICD-10-CM | POA: Diagnosis not present

## 2019-09-05 DIAGNOSIS — R05 Cough: Secondary | ICD-10-CM | POA: Diagnosis not present

## 2019-09-05 DIAGNOSIS — Z20828 Contact with and (suspected) exposure to other viral communicable diseases: Secondary | ICD-10-CM | POA: Diagnosis not present

## 2019-09-05 DIAGNOSIS — R7989 Other specified abnormal findings of blood chemistry: Secondary | ICD-10-CM | POA: Diagnosis not present

## 2019-09-05 DIAGNOSIS — R0602 Shortness of breath: Secondary | ICD-10-CM | POA: Diagnosis not present

## 2019-09-09 DIAGNOSIS — Z20828 Contact with and (suspected) exposure to other viral communicable diseases: Secondary | ICD-10-CM | POA: Diagnosis not present

## 2019-09-10 ENCOUNTER — Emergency Department (HOSPITAL_COMMUNITY): Payer: Federal, State, Local not specified - PPO

## 2019-09-10 ENCOUNTER — Inpatient Hospital Stay (HOSPITAL_COMMUNITY)
Admission: EM | Admit: 2019-09-10 | Discharge: 2019-10-11 | DRG: 207 | Disposition: E | Payer: Federal, State, Local not specified - PPO | Attending: Internal Medicine | Admitting: Internal Medicine

## 2019-09-10 ENCOUNTER — Other Ambulatory Visit: Payer: Self-pay

## 2019-09-10 ENCOUNTER — Encounter (HOSPITAL_COMMUNITY): Payer: Self-pay

## 2019-09-10 DIAGNOSIS — Z452 Encounter for adjustment and management of vascular access device: Secondary | ICD-10-CM

## 2019-09-10 DIAGNOSIS — J398 Other specified diseases of upper respiratory tract: Secondary | ICD-10-CM

## 2019-09-10 DIAGNOSIS — Y95 Nosocomial condition: Secondary | ICD-10-CM | POA: Diagnosis not present

## 2019-09-10 DIAGNOSIS — N179 Acute kidney failure, unspecified: Secondary | ICD-10-CM | POA: Diagnosis not present

## 2019-09-10 DIAGNOSIS — R069 Unspecified abnormalities of breathing: Secondary | ICD-10-CM

## 2019-09-10 DIAGNOSIS — Z01818 Encounter for other preprocedural examination: Secondary | ICD-10-CM

## 2019-09-10 DIAGNOSIS — Z66 Do not resuscitate: Secondary | ICD-10-CM | POA: Diagnosis not present

## 2019-09-10 DIAGNOSIS — K649 Unspecified hemorrhoids: Secondary | ICD-10-CM | POA: Diagnosis present

## 2019-09-10 DIAGNOSIS — N19 Unspecified kidney failure: Secondary | ICD-10-CM

## 2019-09-10 DIAGNOSIS — R6521 Severe sepsis with septic shock: Secondary | ICD-10-CM | POA: Diagnosis not present

## 2019-09-10 DIAGNOSIS — Z515 Encounter for palliative care: Secondary | ICD-10-CM | POA: Diagnosis not present

## 2019-09-10 DIAGNOSIS — I482 Chronic atrial fibrillation, unspecified: Secondary | ICD-10-CM | POA: Diagnosis not present

## 2019-09-10 DIAGNOSIS — Z6835 Body mass index (BMI) 35.0-35.9, adult: Secondary | ICD-10-CM

## 2019-09-10 DIAGNOSIS — Z7901 Long term (current) use of anticoagulants: Secondary | ICD-10-CM

## 2019-09-10 DIAGNOSIS — J189 Pneumonia, unspecified organism: Secondary | ICD-10-CM

## 2019-09-10 DIAGNOSIS — J9601 Acute respiratory failure with hypoxia: Secondary | ICD-10-CM

## 2019-09-10 DIAGNOSIS — J168 Pneumonia due to other specified infectious organisms: Secondary | ICD-10-CM | POA: Diagnosis not present

## 2019-09-10 DIAGNOSIS — Z4659 Encounter for fitting and adjustment of other gastrointestinal appliance and device: Secondary | ICD-10-CM

## 2019-09-10 DIAGNOSIS — Y848 Other medical procedures as the cause of abnormal reaction of the patient, or of later complication, without mention of misadventure at the time of the procedure: Secondary | ICD-10-CM | POA: Diagnosis not present

## 2019-09-10 DIAGNOSIS — R34 Anuria and oliguria: Secondary | ICD-10-CM | POA: Diagnosis not present

## 2019-09-10 DIAGNOSIS — K567 Ileus, unspecified: Secondary | ICD-10-CM | POA: Diagnosis not present

## 2019-09-10 DIAGNOSIS — A0839 Other viral enteritis: Secondary | ICD-10-CM | POA: Diagnosis present

## 2019-09-10 DIAGNOSIS — R000 Tachycardia, unspecified: Secondary | ICD-10-CM | POA: Diagnosis not present

## 2019-09-10 DIAGNOSIS — Z86711 Personal history of pulmonary embolism: Secondary | ICD-10-CM

## 2019-09-10 DIAGNOSIS — A498 Other bacterial infections of unspecified site: Secondary | ICD-10-CM | POA: Diagnosis not present

## 2019-09-10 DIAGNOSIS — J181 Lobar pneumonia, unspecified organism: Secondary | ICD-10-CM | POA: Diagnosis not present

## 2019-09-10 DIAGNOSIS — Z87891 Personal history of nicotine dependence: Secondary | ICD-10-CM

## 2019-09-10 DIAGNOSIS — J80 Acute respiratory distress syndrome: Secondary | ICD-10-CM | POA: Diagnosis not present

## 2019-09-10 DIAGNOSIS — J1282 Pneumonia due to coronavirus disease 2019: Secondary | ICD-10-CM | POA: Diagnosis present

## 2019-09-10 DIAGNOSIS — I10 Essential (primary) hypertension: Secondary | ICD-10-CM | POA: Diagnosis present

## 2019-09-10 DIAGNOSIS — J15211 Pneumonia due to Methicillin susceptible Staphylococcus aureus: Secondary | ICD-10-CM | POA: Diagnosis not present

## 2019-09-10 DIAGNOSIS — H409 Unspecified glaucoma: Secondary | ICD-10-CM | POA: Diagnosis present

## 2019-09-10 DIAGNOSIS — E870 Hyperosmolality and hypernatremia: Secondary | ICD-10-CM | POA: Diagnosis not present

## 2019-09-10 DIAGNOSIS — J95851 Ventilator associated pneumonia: Secondary | ICD-10-CM | POA: Diagnosis not present

## 2019-09-10 DIAGNOSIS — U071 COVID-19: Principal | ICD-10-CM | POA: Diagnosis present

## 2019-09-10 DIAGNOSIS — R739 Hyperglycemia, unspecified: Secondary | ICD-10-CM | POA: Diagnosis not present

## 2019-09-10 DIAGNOSIS — J156 Pneumonia due to other aerobic Gram-negative bacteria: Secondary | ICD-10-CM | POA: Diagnosis not present

## 2019-09-10 DIAGNOSIS — E860 Dehydration: Secondary | ICD-10-CM | POA: Diagnosis not present

## 2019-09-10 DIAGNOSIS — A4153 Sepsis due to Serratia: Secondary | ICD-10-CM | POA: Diagnosis not present

## 2019-09-10 DIAGNOSIS — R7881 Bacteremia: Secondary | ICD-10-CM | POA: Diagnosis not present

## 2019-09-10 DIAGNOSIS — Z09 Encounter for follow-up examination after completed treatment for conditions other than malignant neoplasm: Secondary | ICD-10-CM

## 2019-09-10 DIAGNOSIS — T380X5A Adverse effect of glucocorticoids and synthetic analogues, initial encounter: Secondary | ICD-10-CM | POA: Diagnosis not present

## 2019-09-10 DIAGNOSIS — J9690 Respiratory failure, unspecified, unspecified whether with hypoxia or hypercapnia: Secondary | ICD-10-CM

## 2019-09-10 DIAGNOSIS — J939 Pneumothorax, unspecified: Secondary | ICD-10-CM

## 2019-09-10 DIAGNOSIS — B9561 Methicillin susceptible Staphylococcus aureus infection as the cause of diseases classified elsewhere: Secondary | ICD-10-CM | POA: Diagnosis not present

## 2019-09-10 DIAGNOSIS — J9602 Acute respiratory failure with hypercapnia: Secondary | ICD-10-CM | POA: Diagnosis not present

## 2019-09-10 DIAGNOSIS — Z8249 Family history of ischemic heart disease and other diseases of the circulatory system: Secondary | ICD-10-CM

## 2019-09-10 DIAGNOSIS — Z9911 Dependence on respirator [ventilator] status: Secondary | ICD-10-CM | POA: Diagnosis not present

## 2019-09-10 DIAGNOSIS — K219 Gastro-esophageal reflux disease without esophagitis: Secondary | ICD-10-CM | POA: Diagnosis present

## 2019-09-10 DIAGNOSIS — G4733 Obstructive sleep apnea (adult) (pediatric): Secondary | ICD-10-CM | POA: Diagnosis present

## 2019-09-10 DIAGNOSIS — J9600 Acute respiratory failure, unspecified whether with hypoxia or hypercapnia: Secondary | ICD-10-CM

## 2019-09-10 DIAGNOSIS — R0602 Shortness of breath: Secondary | ICD-10-CM | POA: Diagnosis not present

## 2019-09-10 DIAGNOSIS — R339 Retention of urine, unspecified: Secondary | ICD-10-CM | POA: Diagnosis not present

## 2019-09-10 DIAGNOSIS — A419 Sepsis, unspecified organism: Secondary | ICD-10-CM | POA: Diagnosis not present

## 2019-09-10 HISTORY — DX: Sleep apnea, unspecified: G47.30

## 2019-09-10 LAB — COMPREHENSIVE METABOLIC PANEL
ALT: 58 U/L — ABNORMAL HIGH (ref 0–44)
AST: 58 U/L — ABNORMAL HIGH (ref 15–41)
Albumin: 3.7 g/dL (ref 3.5–5.0)
Alkaline Phosphatase: 47 U/L (ref 38–126)
Anion gap: 13 (ref 5–15)
BUN: 16 mg/dL (ref 6–20)
CO2: 23 mmol/L (ref 22–32)
Calcium: 8.8 mg/dL — ABNORMAL LOW (ref 8.9–10.3)
Chloride: 103 mmol/L (ref 98–111)
Creatinine, Ser: 1.18 mg/dL (ref 0.61–1.24)
GFR calc Af Amer: >60 mL/min (ref >60)
GFR calc non Af Amer: >60 mL/min (ref >60)
Glucose, Bld: 131 mg/dL — ABNORMAL HIGH (ref 70–99)
Potassium: 3.9 mmol/L (ref 3.5–5.1)
Sodium: 139 mmol/L (ref 135–145)
Total Bilirubin: 0.7 mg/dL (ref 0.3–1.2)
Total Protein: 7 g/dL (ref 6.5–8.1)

## 2019-09-10 LAB — CBC WITH DIFFERENTIAL/PLATELET
Abs Immature Granulocytes: 0.02 10*3/uL (ref 0.00–0.07)
Basophils Absolute: 0 10*3/uL (ref 0.0–0.1)
Basophils Relative: 0 %
Eosinophils Absolute: 0 10*3/uL (ref 0.0–0.5)
Eosinophils Relative: 0 %
HCT: 47.7 % (ref 39.0–52.0)
Hemoglobin: 16.1 g/dL (ref 13.0–17.0)
Immature Granulocytes: 0 %
Lymphocytes Relative: 9 %
Lymphs Abs: 0.6 10*3/uL — ABNORMAL LOW (ref 0.7–4.0)
MCH: 32.6 pg (ref 26.0–34.0)
MCHC: 33.8 g/dL (ref 30.0–36.0)
MCV: 96.6 fL (ref 80.0–100.0)
Monocytes Absolute: 0.3 10*3/uL (ref 0.1–1.0)
Monocytes Relative: 3 %
Neutro Abs: 6.4 10*3/uL (ref 1.7–7.7)
Neutrophils Relative %: 88 %
Platelets: 188 10*3/uL (ref 150–400)
RBC: 4.94 MIL/uL (ref 4.22–5.81)
RDW: 12.7 % (ref 11.5–15.5)
WBC: 7.3 10*3/uL (ref 4.0–10.5)
nRBC: 0 % (ref 0.0–0.2)

## 2019-09-10 LAB — LACTIC ACID, PLASMA
Lactic Acid, Venous: 1.9 mmol/L (ref 0.5–1.9)
Lactic Acid, Venous: 1.4 mmol/L (ref 0.5–1.9)

## 2019-09-10 LAB — ABO/RH: ABO/RH(D): A POS

## 2019-09-10 LAB — FERRITIN: Ferritin: 943 ng/mL — ABNORMAL HIGH (ref 24–336)

## 2019-09-10 LAB — D-DIMER, QUANTITATIVE: D-Dimer, Quant: 0.45 ug/mL-FEU (ref 0.00–0.50)

## 2019-09-10 LAB — FIBRINOGEN: Fibrinogen: 567 mg/dL — ABNORMAL HIGH (ref 210–475)

## 2019-09-10 LAB — TRIGLYCERIDES: Triglycerides: 92 mg/dL (ref <150)

## 2019-09-10 LAB — I-STAT ARTERIAL BLOOD GAS, ED
Acid-base deficit: 2 mmol/L (ref 0.0–2.0)
Bicarbonate: 22 mmol/L (ref 20.0–28.0)
Calcium, Ion: 1.16 mmol/L (ref 1.15–1.40)
HCT: 45 % (ref 39.0–52.0)
Hemoglobin: 15.3 g/dL (ref 13.0–17.0)
O2 Saturation: 92 %
Potassium: 3.7 mmol/L (ref 3.5–5.1)
Sodium: 140 mmol/L (ref 135–145)
TCO2: 23 mmol/L (ref 22–32)
pCO2 arterial: 33.6 mmHg (ref 32.0–48.0)
pH, Arterial: 7.424 (ref 7.350–7.450)
pO2, Arterial: 61 mmHg — ABNORMAL LOW (ref 83.0–108.0)

## 2019-09-10 LAB — BRAIN NATRIURETIC PEPTIDE: B Natriuretic Peptide: 81.2 pg/mL (ref 0.0–100.0)

## 2019-09-10 LAB — PROCALCITONIN: Procalcitonin: 0.22 ng/mL

## 2019-09-10 LAB — HIV ANTIBODY (ROUTINE TESTING W REFLEX): HIV Screen 4th Generation wRfx: NONREACTIVE

## 2019-09-10 LAB — LACTATE DEHYDROGENASE: LDH: 589 U/L — ABNORMAL HIGH (ref 98–192)

## 2019-09-10 LAB — TROPONIN I (HIGH SENSITIVITY): Troponin I (High Sensitivity): 14 ng/L (ref <18)

## 2019-09-10 LAB — C-REACTIVE PROTEIN: CRP: 7 mg/dL — ABNORMAL HIGH (ref <1.0)

## 2019-09-10 MED ORDER — APIXABAN 5 MG PO TABS
5.0000 mg | ORAL_TABLET | Freq: Two times a day (BID) | ORAL | Status: DC
  Administered 2019-09-10 – 2019-09-17 (×15): 5 mg via ORAL
  Filled 2019-09-10 (×15): qty 1

## 2019-09-10 MED ORDER — PANTOPRAZOLE SODIUM 40 MG PO TBEC
40.0000 mg | DELAYED_RELEASE_TABLET | Freq: Two times a day (BID) | ORAL | Status: DC
  Administered 2019-09-10 – 2019-09-17 (×16): 40 mg via ORAL
  Filled 2019-09-10 (×16): qty 1

## 2019-09-10 MED ORDER — DEXAMETHASONE 6 MG PO TABS
6.0000 mg | ORAL_TABLET | ORAL | Status: DC
  Administered 2019-09-11 – 2019-09-17 (×7): 6 mg via ORAL
  Filled 2019-09-10: qty 2
  Filled 2019-09-10: qty 1.5
  Filled 2019-09-10 (×4): qty 2
  Filled 2019-09-10: qty 1
  Filled 2019-09-10: qty 2

## 2019-09-10 MED ORDER — HYDROCOD POLST-CPM POLST ER 10-8 MG/5ML PO SUER: 5.0000 mL | Freq: Two times a day (BID) | ORAL | Status: DC | PRN

## 2019-09-10 MED ORDER — SODIUM CHLORIDE 0.9 % IV SOLN
100.0000 mg | Freq: Every day | INTRAVENOUS | Status: AC
  Administered 2019-09-11 – 2019-09-14 (×4): 100 mg via INTRAVENOUS
  Filled 2019-09-10 (×4): qty 20

## 2019-09-10 MED ORDER — POLYETHYLENE GLYCOL 3350 17 G PO PACK: 17.0000 g | PACK | Freq: Every day | ORAL | Status: DC | PRN

## 2019-09-10 MED ORDER — SODIUM CHLORIDE 0.9 % IV SOLN
200.0000 mg | Freq: Once | INTRAVENOUS | Status: AC
  Administered 2019-09-10: 200 mg via INTRAVENOUS
  Filled 2019-09-10: qty 40

## 2019-09-10 MED ORDER — MELATONIN 3 MG PO TABS
3.0000 mg | ORAL_TABLET | Freq: Every day | ORAL | Status: DC
  Administered 2019-09-10 – 2019-09-17 (×8): 3 mg via ORAL
  Filled 2019-09-10 (×9): qty 1

## 2019-09-10 MED ORDER — ALBUTEROL SULFATE HFA 108 (90 BASE) MCG/ACT IN AERS
4.0000 | INHALATION_SPRAY | Freq: Once | RESPIRATORY_TRACT | Status: AC
  Administered 2019-09-10: 4 via RESPIRATORY_TRACT
  Filled 2019-09-10: qty 6.7

## 2019-09-10 MED ORDER — DEXAMETHASONE SODIUM PHOSPHATE 10 MG/ML IJ SOLN
10.0000 mg | Freq: Once | INTRAMUSCULAR | Status: AC
  Administered 2019-09-10: 10 mg via INTRAVENOUS
  Filled 2019-09-10: qty 1

## 2019-09-10 MED ORDER — ACETAMINOPHEN 325 MG PO TABS
650.0000 mg | ORAL_TABLET | Freq: Once | ORAL | Status: AC
  Administered 2019-09-10: 650 mg via ORAL
  Filled 2019-09-10: qty 2

## 2019-09-10 MED ORDER — LOPERAMIDE HCL 2 MG PO CAPS
2.0000 mg | ORAL_CAPSULE | ORAL | Status: DC | PRN
  Administered 2019-09-10: 2 mg via ORAL
  Filled 2019-09-10 (×3): qty 1

## 2019-09-10 MED ORDER — GUAIFENESIN-DM 100-10 MG/5ML PO SYRP
10.0000 mL | ORAL_SOLUTION | ORAL | Status: DC | PRN
  Administered 2019-09-10 – 2019-09-17 (×12): 10 mL via ORAL
  Filled 2019-09-10 (×15): qty 10

## 2019-09-10 MED ORDER — LATANOPROST 0.005 % OP SOLN
1.0000 [drp] | Freq: Every day | OPHTHALMIC | Status: DC
  Administered 2019-09-10 – 2019-09-28 (×19): 1 [drp] via OPHTHALMIC
  Filled 2019-09-10 (×2): qty 2.5

## 2019-09-10 MED ORDER — LACTATED RINGERS IV BOLUS
1000.0000 mL | Freq: Once | INTRAVENOUS | Status: AC
  Administered 2019-09-10: 1000 mL via INTRAVENOUS

## 2019-09-10 NOTE — ED Triage Notes (Signed)
Patient reports that he was diagnosed with covid on Thursday and has had increasing SOB since Saturday. Complains of increased cough, congestion and fatigue

## 2019-09-10 NOTE — ED Provider Notes (Signed)
[facility] EMERGENCY DEPARTMENT Provider Note   CSN: [id number] Arrival date & time: 10/09/19  9767     History No chief complaint on file.   Samuel Peterson is a 54 y.o. male.  HPI      Samuel Peterson is a 54 y.o. male, with a history of sleep apnea, PE, obesity, presenting to the ED with worsening shortness of breath and chest pain over the last 3 to 4 days. Patient states he began having cough and shortness of breath Wednesday, August 25. He went to be evaluated and tested positive for COVID-19 August 26. August 28 he states his symptoms really began to worsen, especially with his shortness of breath. He voices compliance with all his medications, despite decreased appetite. He has not been vaccinated against COVID-19. He states he is anticoagulated on Eliquis due to his history of PE, voices compliance with this medication. He is supposed to use a CPAP at night and has only been using it intermittently due to his shortness of breath and coughing. Denies nausea, vomiting, diarrhea, syncope, or any other complaints.   Past Medical History:  Diagnosis Date  . Pulmonary emboli (HCC) 2018  . Sleep apnea    Uses CPAP at night    Patient Active Problem List   Diagnosis Date Noted  . COVID-19 Oct 09, 2019    Past Surgical History:  Procedure Laterality Date  . CARPAL TUNNEL RELEASE    . LAMINECTOMY    . NASAL SEPTUM SURGERY    . NASAL SINUS SURGERY    . NECK SURGERY         History reviewed. No pertinent family history.  Social History   Tobacco Use  . Smoking status: Former Games developer  . Smokeless tobacco: Never Used  Substance Use Topics  . Alcohol use: Not on file  . Drug use: Not on file    Home Medications Prior to Admission medications   Medication Sig Start Date End Date Taking? Authorizing Provider  azithromycin (ZITHROMAX) 500 MG tablet Take 500 mg by mouth daily. 3 day course 09/09/19  Yes [provider]  benzonatate  (TESSALON) 100 MG capsule Take 100 mg by mouth 3 (three) times daily as needed for cough.  09/06/19  Yes [provider]  dexamethasone (DECADRON) 2 MG tablet Take 6 mg by mouth daily. 5 day course 09/06/19  Yes [provider]  DM-Acetaminophen-Triprolidine (MUCINEX NIGHT COLD/FLU MAX STR) 10-325-1.25 MG TABS Take 2 tablets by mouth at bedtime as needed (cold and cough).   Yes [provider]  ELIQUIS 5 MG TABS tablet Take 5 mg by mouth 2 (two) times daily. 06/06/19  Yes [provider]  latanoprost (XALATAN) 0.005 % ophthalmic solution Place 1 drop into both eyes at bedtime. 01/29/16  Yes [provider]  pantoprazole (PROTONIX) 40 MG tablet Take 40 mg by mouth 2 (two) times daily. 04/12/19  Yes [provider]  Phenylephrine-DM-GG-APAP (MUCINEX FREEFROM COLD/FLU DAY PO) Take 2 tablets by mouth 2 (two) times daily as needed (cough and cold).   Yes [provider]  predniSONE (DELTASONE) 20 MG tablet Take by mouth as directed. 12 day course dosepack 09/09/19  Yes [provider]    Allergies    Patient has no known allergies.  Review of Systems   Review of Systems  Constitutional: Positive for appetite change and fatigue. Negative for diaphoresis.  Respiratory: Positive for cough and shortness of breath.   Cardiovascular: Positive for chest pain. Negative for  leg swelling.  Gastrointestinal: Positive for abdominal pain. Negative for diarrhea, nausea and vomiting.  All other systems reviewed and are negative.   Physical Exam Updated Vital Signs BP (!) 180/93 (BP Location: Right Arm)   Pulse (!) 105   Temp 98.5 F (36.9 C)   Resp (!) 26   Ht 5' 9.5" (1.765 m)   Wt 111.1 kg   SpO2 (!) 77%   BMI 35.66 kg/m   Physical Exam Vitals and nursing note reviewed.  Constitutional:      General: He is in acute distress.     Appearance: He is well-developed. He is obese. He is ill-appearing. He is not diaphoretic.  HENT:      Head: Normocephalic and atraumatic.     Mouth/Throat:     Mouth: Mucous membranes are moist.     Pharynx: Oropharynx is clear.  Eyes:     Conjunctiva/sclera: Conjunctivae normal.  Cardiovascular:     Rate and Rhythm: Regular rhythm. Tachycardia present.     Pulses: Normal pulses.          Radial pulses are 2+ on the right side and 2+ on the left side.       Posterior tibial pulses are 2+ on the right side and 2+ on the left side.     Heart sounds: Normal heart sounds.     Comments: Tactile temperature in the extremities appropriate and equal bilaterally. Pulmonary:     Effort: Tachypnea and respiratory distress present.     Breath sounds: Normal breath sounds.     Comments: Increased work of breathing, speaking in short phrases.  Conversationally dyspneic. SPO2 77% on room air, 83% on 2 L supplemental O2, requiring nonrebreather and nasal cannula to maintain 92%. Abdominal:     Palpations: Abdomen is soft.     Tenderness: There is no abdominal tenderness. There is no guarding.  Musculoskeletal:     Cervical back: Neck supple.     Right lower leg: No edema.     Left lower leg: No edema.  Lymphadenopathy:     Cervical: No cervical adenopathy.  Skin:    General: Skin is warm and dry.  Neurological:     Mental Status: He is alert.  Psychiatric:        Mood and Affect: Mood and affect normal.        Speech: Speech normal.        Behavior: Behavior normal.     ED Results / Procedures / Treatments   Labs (all labs ordered are listed, but only abnormal results are displayed) Labs Reviewed  COMPREHENSIVE METABOLIC PANEL - Abnormal; Notable for the following components:      Result Value   Glucose, Bld 131 (*)    Calcium 8.8 (*)    AST 58 (*)    ALT 58 (*)    All other components within normal limits  CBC WITH DIFFERENTIAL/PLATELET - Abnormal; Notable for the following components:   Lymphs Abs 0.6 (*)    All other components within normal limits  FERRITIN - Abnormal; Notable  for the following components:   Ferritin 943 (*)    All other components within normal limits  FIBRINOGEN - Abnormal; Notable for the following components:   Fibrinogen 567 (*)    All other components within normal limits  C-REACTIVE PROTEIN - Abnormal; Notable for the following components:   CRP 7.0 (*)    All other components within normal limits  I-STAT ARTERIAL BLOOD GAS, ED - Abnormal;  Notable for the following components:   pO2, Arterial 61 (*)    All other components within normal limits  CULTURE, BLOOD (ROUTINE X 2)  CULTURE, BLOOD (ROUTINE X 2)  LACTIC ACID, PLASMA  D-DIMER, QUANTITATIVE (NOT AT [facility])  BRAIN NATRIURETIC PEPTIDE  LACTIC ACID, PLASMA  LACTATE DEHYDROGENASE  PROCALCITONIN  TRIGLYCERIDES  HIV ANTIBODY (ROUTINE TESTING W REFLEX)  ABO/RH  TROPONIN I (HIGH SENSITIVITY)  TROPONIN I (HIGH SENSITIVITY)    EKG EKG Interpretation  Date/Time:  Tuesday September 10 2019 10:10:29 EDT Ventricular Rate:  110 PR Interval:    QRS Duration: 94 QT Interval:  329 QTC Calculation: 445 R Axis:   42 Text Interpretation: Sinus tachycardia Probable left atrial enlargement RSR' in V1 or V2, right VCD or RVH Confirmed by Tilden Fossa [phone] on 08/29/2019 12:46:46 PM   Radiology DG Chest Port 1 View  Result Date: 09/02/2019 CLINICAL DATA:  COVID, shortness of breath EXAM: PORTABLE CHEST 1 VIEW COMPARISON:  None. FINDINGS: The heart size and mediastinal contours are within normal limits. Diffuse bilateral interstitial and heterogeneous airspace opacity. The visualized skeletal structures are unremarkable. IMPRESSION: Diffuse bilateral interstitial and heterogeneous airspace opacity, consistent with COVID-19 airspace disease. Electronically Signed   By: Lauralyn Primes M.D.   On: 08/14/2019 11:06    Procedures .Critical Care Performed by: Anselm Pancoast, PA-C Authorized by: Anselm Pancoast, PA-C   Critical care provider statement:    Critical care time (minutes):  45   Critical  care time was exclusive of:  Separately billable procedures and treating other patients   Critical care was necessary to treat or prevent imminent or life-threatening deterioration of the following conditions:  Respiratory failure   Critical care was time spent personally by me on the following activities:  Development of treatment plan with patient or surrogate, ordering and performing treatments and interventions, ordering and review of laboratory studies, ordering and review of radiographic studies, pulse oximetry, re-evaluation of patient's condition, review of old charts, obtaining history from patient or surrogate, evaluation of patient's response to treatment, discussions with consultants and examination of patient   I assumed direction of critical care for this patient from another provider in my specialty: no     (including critical care time)  Medications Ordered in ED Medications  pantoprazole (PROTONIX) EC tablet 40 mg (40 mg Oral Given 09/05/2019 1249)  apixaban (ELIQUIS) tablet 5 mg (has no administration in time range)  latanoprost (XALATAN) 0.005 % ophthalmic solution 1 drop (has no administration in time range)  remdesivir 200 mg in sodium chloride 0.9% 250 mL IVPB (200 mg Intravenous New Bag/Given 08/25/2019 1251)    Followed by  remdesivir 100 mg in sodium chloride 0.9 % 100 mL IVPB (has no administration in time range)  dexamethasone (DECADRON) tablet 6 mg (has no administration in time range)  guaiFENesin-dextromethorphan (ROBITUSSIN DM) 100-10 MG/5ML syrup 10 mL (10 mLs Oral Given 09/07/2019 1247)  chlorpheniramine-HYDROcodone (TUSSIONEX) 10-8 MG/5ML suspension 5 mL (has no administration in time range)  polyethylene glycol (MIRALAX / GLYCOLAX) packet 17 g (has no administration in time range)  albuterol (VENTOLIN HFA) 108 (90 Base) MCG/ACT inhaler 4 puff (4 puffs Inhalation Given 08/25/2019 1251)  dexamethasone (DECADRON) injection 10 mg (10 mg Intravenous Given 08/31/2019 1115)    lactated ringers bolus 1,000 mL (1,000 mLs Intravenous New Bag/Given 08/27/2019 1249)    ED Course  I have reviewed the triage vital signs and the nursing notes.  Pertinent labs & imaging results that were available during  my care of the patient were reviewed by me and considered in my medical decision making (see chart for details).  Clinical Course as of Sep 09 1249  Tue 2019/09/13  1041 Patient now with SPO2 93% on 15 L nonrebreather.   [SJ]  1134 Spoke with Dr. Dortha Schwalbe, IM resident. Agrees to evaluate the patient for admission.   [SJ]    Clinical Course User Index [SJ] Teddy Pena, Hillard Danker, PA-C   MDM Rules/Calculators/A&P                          Patient presents with worsening shortness of breath in the setting of COVID-19 infection. Positive Covid test verified through care everywhere on August 26. Hypoxic down to 77% on room air, tachypneic, increased work of breathing, tachycardic.  Afebrile here, but patient reports fever at home.  No hypotension.  Suspect vital sign abnormalities are due to COVID-19 infection rather than sepsis from bacterial source. I personally reviewed the patient's lab work and imaging studies available at time of admission. Chest x-ray with findings consistent with COVID-19 infection. Patient admitted for further management.  Findings and plan of care discussed with Tilden Fossa, MD. Dr. Madilyn Hook personally evaluated and examined this patient.  Vitals:   Sep 13, 2019 0933 09/13/19 1045 09-13-2019 1100  BP: (!) 180/93 (!) 145/132 (!) 158/113  Pulse: (!) 105 (!) 104 (!) 109  Resp: (!) 26 (!) 23 (!) 39  Temp: 98.5 F (36.9 C)    SpO2: (!) 77% 94% 93%  Weight: 111.1 kg    Height: 5' 9.5" (1.765 m)       Final Clinical Impression(s) / ED Diagnoses Final diagnoses:  COVID-19  Acute respiratory failure with hypoxia [facility])    Rx / DC Orders ED Discharge Orders    None       Concepcion Living 09/13/2019 1252    Tilden Fossa, MD 09/11/19 [phone]

## 2019-09-10 NOTE — H&P (Addendum)
Date: 2019/09/28               Patient Name:  Samuel Peterson MRN: [id number]  DOB: 1965-02-11 Age / Sex: 54 y.o., male   PCP: Richardean Chimera, MD         Medical Service: Internal Medicine Teaching Service         Attending Physician: Dr. Sandre Kitty    First Contact: Dr. Imogene Burn Pager: [phone]  Second Contact: Maryla Morrow, MD, Ellie Pager: [phone])       After Hours (After 5p/  First Contact Pager: [phone]  weekends / holidays): Second Contact Pager: [phone]   Chief Complaint: Shortness of Breath  History of Present Illness: Samuel Peterson is a 54 year old gentleman with medical history significant for pulmonary embolism on Eliquis, obstructive sleep apnea, glaucoma, GERD and morbid obesity who presented to the emergency department with progressively worsening shortness of breath.  He states that he tested positive for SARS-CoV-2 on September 05, 2019 and has since been experiencing symptoms.  He endorses cough, decreased appetite, pleuritic chest pain, abdominal pain with cough, diarrhea however denies fevers, chills, nausea, vomiting.  Patient is unvaccinated and states that his wife also tested positive for COVID.    Lab Orders     Blood Culture (routine x 2)     Lactic acid, plasma     Comprehensive metabolic panel     CBC with Differential     D-dimer, quantitative     Ferritin     Fibrinogen     C-reactive protein     Brain natriuretic peptide     Lactate dehydrogenase     Procalcitonin     Triglycerides     HIV Antibody (routine testing w rflx)     CBC with Differential/Platelet     Comprehensive metabolic panel     C-reactive protein     D-dimer, quantitative (not at [facility])     I-Stat arterial blood gas, [facility] ED)   Meds:  Current Meds  Medication Sig  . azithromycin (ZITHROMAX) 500 MG tablet Take 500 mg by mouth daily. 3 day course  . benzonatate (TESSALON) 100 MG capsule Take 100 mg by mouth 3 (three) times daily as needed for cough.   . dexamethasone (DECADRON)  2 MG tablet Take 6 mg by mouth daily. 5 day course  . DM-Acetaminophen-Triprolidine (MUCINEX NIGHT COLD/FLU MAX STR) 10-325-1.25 MG TABS Take 2 tablets by mouth at bedtime as needed (cold and cough).  Marland Kitchen ELIQUIS 5 MG TABS tablet Take 5 mg by mouth 2 (two) times daily.  Marland Kitchen latanoprost (XALATAN) 0.005 % ophthalmic solution Place 1 drop into both eyes at bedtime.  . pantoprazole (PROTONIX) 40 MG tablet Take 40 mg by mouth 2 (two) times daily.  Marland Kitchen Phenylephrine-DM-GG-APAP (MUCINEX FREEFROM COLD/FLU DAY PO) Take 2 tablets by mouth 2 (two) times daily as needed (cough and cold).  . predniSONE (DELTASONE) 20 MG tablet Take by mouth as directed. 12 day course dosepack     Allergies: Allergies as of 09/28/2019  . (No Known Allergies)   Past Medical History:  Diagnosis Date  . Pulmonary emboli (HCC) 2018  . Sleep apnea    Uses CPAP at night    Family History: HTN  Social History: Lives with wife, occasionally drinks alcohol. Denies tobacco use or illicit drug use  Review of Systems: A complete ROS was negative except as per HPI.   Physical Exam: Blood pressure (!) 158/113, pulse (!) 109, temperature 98.5 F (36.9  C), resp. rate (!) 39, height 5' 9.5" (1.765 m), weight 111.1 kg, SpO2 93 %. Physical Exam Constitutional:      General: Distressed: Moderate distress due to SOB and Cough.     Appearance: He is not toxic-appearing.  HENT:     Head: Normocephalic and atraumatic.     Mouth/Throat:     Mouth: Mucous membranes are dry.  Eyes:     General:        Right eye: No discharge.        Left eye: No discharge.  Cardiovascular:     Rate and Rhythm: Normal rate.     Heart sounds: No murmur heard.   Pulmonary:     Breath sounds: No stridor. Rhonchi and rales present. No wheezing.  Chest:     Chest wall: No tenderness.  Abdominal:     General: Abdomen is flat. There is no distension.     Palpations: Abdomen is soft.  Musculoskeletal:        General: No swelling.     Cervical back:  Neck supple. No rigidity.  Skin:    Coloration: Skin is not jaundiced.  Neurological:     Mental Status: He is alert.  Psychiatric:     Comments: Anxious     EKG: personally reviewed my interpretation is sinus tachycardia  CXR: personally reviewed my interpretation is bilateral interstitial disease  Assessment & Plan by Problem: Principal Problem:   COVID-19  #Acute hypoxic respiratory failure secondary to COVID-19 pneumonia On NRB 15L with SpO2 in the 90s.  -Remdesivir day 1/5 -Decadron day 1/10 -Follow-up daily CRP, CMP, CBC, D-dimer -Supplemental oxygen as needed -Antitussives: Robitussin-DM, Tussionex -Supplemental oxygen as needed   #Transaminitis 2/2 acute inflammation from SARS-CoV-2 -Follow-up daily CMP   #Asymptomatic hypertension Found to be hypertensive to 184/93 in the emergency department.  No prior documentation of hypertension. BP now improved in the with SBP in the 120s -Continue to monitor   #Pulmonary Embolism Diagnosed in 2018 -Continue Eliquis   FEN: Heart healthy VTE ppx: Eliquis CODE STATUS: Full code  Prior to Admission Living Arrangement: Home Anticipated Discharge Location: Home Barriers to Discharge: Treatment of COVID-19 pneumonia  Dispo: Admit patient to Inpatient with expected length of stay greater than 2 midnights.  Signed: Yvette Rack, MD 09/17/2019, 12:04 PM  Pager: [phone] Internal Medicine Teaching Service After 5pm on weekdays and 1pm on weekends: On Call pager: [phone]

## 2019-09-11 DIAGNOSIS — U071 COVID-19: Secondary | ICD-10-CM | POA: Diagnosis not present

## 2019-09-11 DIAGNOSIS — J9601 Acute respiratory failure with hypoxia: Secondary | ICD-10-CM | POA: Diagnosis not present

## 2019-09-11 LAB — COMPREHENSIVE METABOLIC PANEL
ALT: 51 U/L — ABNORMAL HIGH (ref 0–44)
AST: 56 U/L — ABNORMAL HIGH (ref 15–41)
Albumin: 3.4 g/dL — ABNORMAL LOW (ref 3.5–5.0)
Alkaline Phosphatase: 52 U/L (ref 38–126)
Anion gap: 15 (ref 5–15)
BUN: 18 mg/dL (ref 6–20)
CO2: 22 mmol/L (ref 22–32)
Calcium: 8.8 mg/dL — ABNORMAL LOW (ref 8.9–10.3)
Chloride: 100 mmol/L (ref 98–111)
Creatinine, Ser: 1.07 mg/dL (ref 0.61–1.24)
GFR calc Af Amer: >60 mL/min (ref >60)
GFR calc non Af Amer: >60 mL/min (ref >60)
Glucose, Bld: 107 mg/dL — ABNORMAL HIGH (ref 70–99)
Potassium: 3.9 mmol/L (ref 3.5–5.1)
Sodium: 137 mmol/L (ref 135–145)
Total Bilirubin: 0.8 mg/dL (ref 0.3–1.2)
Total Protein: 7.5 g/dL (ref 6.5–8.1)

## 2019-09-11 LAB — CBC WITH DIFFERENTIAL/PLATELET
Abs Immature Granulocytes: 0.03 10*3/uL (ref 0.00–0.07)
Basophils Absolute: 0 10*3/uL (ref 0.0–0.1)
Basophils Relative: 0 %
Eosinophils Absolute: 0 10*3/uL (ref 0.0–0.5)
Eosinophils Relative: 0 %
HCT: 48.8 % (ref 39.0–52.0)
Hemoglobin: 16.3 g/dL (ref 13.0–17.0)
Immature Granulocytes: 0 %
Lymphocytes Relative: 16 %
Lymphs Abs: 1.2 10*3/uL (ref 0.7–4.0)
MCH: 32.9 pg (ref 26.0–34.0)
MCHC: 33.4 g/dL (ref 30.0–36.0)
MCV: 98.4 fL (ref 80.0–100.0)
Monocytes Absolute: 0.6 10*3/uL (ref 0.1–1.0)
Monocytes Relative: 9 %
Neutro Abs: 5.7 10*3/uL (ref 1.7–7.7)
Neutrophils Relative %: 75 %
Platelets: 191 10*3/uL (ref 150–400)
RBC: 4.96 MIL/uL (ref 4.22–5.81)
RDW: 12.8 % (ref 11.5–15.5)
WBC: 7.5 10*3/uL (ref 4.0–10.5)
nRBC: 0 % (ref 0.0–0.2)

## 2019-09-11 LAB — C-REACTIVE PROTEIN: CRP: 6.8 mg/dL — ABNORMAL HIGH (ref <1.0)

## 2019-09-11 LAB — D-DIMER, QUANTITATIVE: D-Dimer, Quant: 0.95 ug/mL-FEU — ABNORMAL HIGH (ref 0.00–0.50)

## 2019-09-11 MED ORDER — OXYMETAZOLINE HCL 0.05 % NA SOLN
1.0000 | Freq: Once | NASAL | Status: AC
  Administered 2019-09-11: 1 via NASAL
  Filled 2019-09-11: qty 30

## 2019-09-11 MED ORDER — BARICITINIB 2 MG PO TABS
4.0000 mg | ORAL_TABLET | Freq: Every day | ORAL | Status: DC
  Administered 2019-09-11 – 2019-09-17 (×7): 4 mg via ORAL
  Filled 2019-09-11 (×7): qty 2

## 2019-09-11 MED ORDER — ACETAMINOPHEN 325 MG PO TABS
650.0000 mg | ORAL_TABLET | Freq: Four times a day (QID) | ORAL | Status: DC | PRN
  Administered 2019-09-12 – 2019-09-16 (×4): 650 mg via ORAL
  Filled 2019-09-11 (×6): qty 2

## 2019-09-11 MED ORDER — BENZONATATE 100 MG PO CAPS
200.0000 mg | ORAL_CAPSULE | Freq: Two times a day (BID) | ORAL | Status: DC
  Administered 2019-09-11 (×2): 200 mg via ORAL
  Filled 2019-09-11 (×2): qty 2

## 2019-09-11 NOTE — Progress Notes (Signed)
   Subjective:   Patient states shortness of breath is similar to yesterday, mostly it is his cough that is bothering him. He is unable to lie flat or lay in the bed as it feels difficult to breath when he does this. He denies dizziness. He has been having diarrhea. He denies difficulty with urination. He denies chest pain except he has some abdominal pain and chest pain when he coughs from his muscles being sore.   Objective:  Vital signs in last 24 hours: Vitals:   09/05/2019 2115 08/23/2019 2320 09/11/19 0100 09/11/19 0215  BP: 122/86  128/82   Pulse: (!) 102 91 89 87  Resp: (!) 23  (!) 24 (!) 23  Temp:      TempSrc:      SpO2: (!) 88% (!) 89% (!) 88% 91%  Weight:      Height:       Supplemental Oxygen: 15L non-rebreather and 7L [place] at 91%  Constitution: acutely ill appearing, sitting up in chair HENT: [place]/AT Eyes: no injection or icterus Cardio: RRR, no m/r/g, trace edema Respiratory: decreased breath sounds lower lobes, labored breathing, no wheezing rales or rhonchi Abdominal: mild tenderness, soft, non-distended MSK: moving all extremities Neuro: normal affect, a&ox3 Skin: c/d/i   CXR 8/31: Diffuse bilateral interstitial and heterogeneous airspace opacity, consistent with COVID-19 airspace disease.  Electronically Signed   By: Lauralyn Primes M.D.   On: 08/13/2019 11:06  Assessment/Plan:  Principal Problem:   COVID-19  54yo male with PMH of PE on eliquis, OSA, glaucoma, GERD and morbid obesity presenting with covid-19, first diagnosed 8/26.   COVID-19 Diagnosed 8/26, currently requiring 15L non-rebreather with addition of 7L [place], continuing this currently at 91%. XR yesterday with diffuse, heterogenous opacity. Shortness of breath is slightly improved although lots of coughing. BNP yesterday was 81, he does not appear volume overloaded on exam. Inflammatory markers similar to yesterday, transaminitis present.   - cont. Robitussin and tussionex prn, add benzonatate  scheduled - remdesivir day 2/5 - decadron day 2 - start baricitinib, discussed risks and benefits with patient  - add heated high flow - loperimide prn diarrhea  Transaminitis Likely in setting of covid infection.   History of PE  - cont. eliquis   VTE: eliquis IVF: none Diet: HH Code: full   Dispo: Anticipated discharge pending medical improvement.   Alece Koppel A, DO 09/11/2019, 8:16 AM Pager: [phone] After 5pm on weekdays and 1pm on weekends: On Call Pager: [phone]

## 2019-09-11 DEATH — deceased

## 2019-09-12 DIAGNOSIS — J9601 Acute respiratory failure with hypoxia: Secondary | ICD-10-CM | POA: Diagnosis present

## 2019-09-12 DIAGNOSIS — U071 COVID-19: Secondary | ICD-10-CM | POA: Diagnosis not present

## 2019-09-12 LAB — COMPREHENSIVE METABOLIC PANEL
ALT: 71 U/L — ABNORMAL HIGH (ref 0–44)
AST: 70 U/L — ABNORMAL HIGH (ref 15–41)
Albumin: 3.7 g/dL (ref 3.5–5.0)
Alkaline Phosphatase: 64 U/L (ref 38–126)
Anion gap: 8 (ref 5–15)
BUN: 25 mg/dL — ABNORMAL HIGH (ref 6–20)
CO2: 29 mmol/L (ref 22–32)
Calcium: 8.7 mg/dL — ABNORMAL LOW (ref 8.9–10.3)
Chloride: 102 mmol/L (ref 98–111)
Creatinine, Ser: 1.11 mg/dL (ref 0.61–1.24)
GFR calc Af Amer: >60 mL/min (ref >60)
GFR calc non Af Amer: >60 mL/min (ref >60)
Glucose, Bld: 122 mg/dL — ABNORMAL HIGH (ref 70–99)
Potassium: 3.7 mmol/L (ref 3.5–5.1)
Sodium: 139 mmol/L (ref 135–145)
Total Bilirubin: 1.2 mg/dL (ref 0.3–1.2)
Total Protein: 7.4 g/dL (ref 6.5–8.1)

## 2019-09-12 LAB — CBC WITH DIFFERENTIAL/PLATELET
Abs Immature Granulocytes: 0.05 10*3/uL (ref 0.00–0.07)
Basophils Absolute: 0 10*3/uL (ref 0.0–0.1)
Basophils Relative: 0 %
Eosinophils Absolute: 0 10*3/uL (ref 0.0–0.5)
Eosinophils Relative: 0 %
HCT: 49.4 % (ref 39.0–52.0)
Hemoglobin: 16.7 g/dL (ref 13.0–17.0)
Immature Granulocytes: 1 %
Lymphocytes Relative: 16 %
Lymphs Abs: 1.3 10*3/uL (ref 0.7–4.0)
MCH: 32.2 pg (ref 26.0–34.0)
MCHC: 33.8 g/dL (ref 30.0–36.0)
MCV: 95.4 fL (ref 80.0–100.0)
Monocytes Absolute: 0.6 10*3/uL (ref 0.1–1.0)
Monocytes Relative: 8 %
Neutro Abs: 6 10*3/uL (ref 1.7–7.7)
Neutrophils Relative %: 75 %
Platelets: 235 10*3/uL (ref 150–400)
RBC: 5.18 MIL/uL (ref 4.22–5.81)
RDW: 12.4 % (ref 11.5–15.5)
WBC: 8 10*3/uL (ref 4.0–10.5)
nRBC: 0 % (ref 0.0–0.2)

## 2019-09-12 LAB — PHOSPHORUS: Phosphorus: 3.4 mg/dL (ref 2.5–4.6)

## 2019-09-12 LAB — GLUCOSE, CAPILLARY: Glucose-Capillary: 170 mg/dL — ABNORMAL HIGH (ref 70–99)

## 2019-09-12 LAB — C-REACTIVE PROTEIN: CRP: 1.9 mg/dL — ABNORMAL HIGH (ref <1.0)

## 2019-09-12 LAB — MAGNESIUM: Magnesium: 2 mg/dL (ref 1.7–2.4)

## 2019-09-12 LAB — D-DIMER, QUANTITATIVE: D-Dimer, Quant: 3.48 ug/mL-FEU — ABNORMAL HIGH (ref 0.00–0.50)

## 2019-09-12 MED ORDER — HYDROCOD POLST-CPM POLST ER 10-8 MG/5ML PO SUER
5.0000 mL | Freq: Two times a day (BID) | ORAL | Status: DC
  Administered 2019-09-12 – 2019-09-17 (×12): 5 mL via ORAL
  Filled 2019-09-12 (×12): qty 5

## 2019-09-12 MED ORDER — FUROSEMIDE 10 MG/ML IJ SOLN
20.0000 mg | Freq: Once | INTRAMUSCULAR | Status: AC
  Administered 2019-09-12: 20 mg via INTRAVENOUS
  Filled 2019-09-12: qty 2

## 2019-09-12 MED ORDER — SALINE SPRAY 0.65 % NA SOLN
1.0000 | NASAL | Status: DC | PRN
  Administered 2019-09-12: 1 via NASAL
  Filled 2019-09-12: qty 44

## 2019-09-12 MED ORDER — BENZONATATE 100 MG PO CAPS
200.0000 mg | ORAL_CAPSULE | Freq: Three times a day (TID) | ORAL | Status: DC
  Administered 2019-09-12 – 2019-09-17 (×17): 200 mg via ORAL
  Filled 2019-09-12 (×17): qty 2

## 2019-09-12 NOTE — Plan of Care (Signed)

## 2019-09-12 NOTE — Progress Notes (Signed)
Patient placed on heated high flow [place] 25L 100% and a NRB mask at 15L.  Patient states his breathing feels a little better.

## 2019-09-12 NOTE — Progress Notes (Signed)
   Subjective:   Pain overnight with coughing treated with tylenol. He continues to have cough although does not feel terribly short of breath. His diarrhea has resolved, which was causing bleeding from his hemorrhoids. This has also stopped.   Objective:  Vital signs in last 24 hours: Vitals:   09/11/19 1400 09/11/19 1500 09/11/19 1718 09/12/19 0349  BP: 112/81 114/78 119/65 (!) 116/94  Pulse: 79 87 92 63  Resp: (!) 27 (!) 30 18 20   Temp:   97.6 F (36.4 C) 97.7 F (36.5 C)  TempSrc:      SpO2: (!) 85% (!) 88% (!) 80% 96%  Weight:      Height:        Supplemental Oxygen: 15L NRB  Constitution: tired appearing, appears stated age Cardio: RRR, no m/r/g, trace pitting edema, +pedal pulses Respiratory: labored breathing, decreased breath sounds, no w/r/r Abdominal: NTTP, soft, non-distended MSK: moving all extremities, sensation intact LE bilaterally Neuro: normal affect, a&ox3 Skin: c/d/i   Assessment/Plan:  Principal Problem:   COVID-19  54yo amle with PMH of PE on eliquis, OSA, GERD, and obesity presenting with covid-19 pneumonia, first diagnosed 8/26, admitted with worsening shortness of breath and new oxygen requirement.   COVID-19 Hospital day 3. Continues to have cough that is painful and bothersome. Previous risk factors include history of PE, chronic intermittent respiratory issues all treated outpatient, and OSA. Inflammatory labs similar to yesterday. Appears slightly better today, but desats to 85% on 15L non-rebreather. Discussed with nursing and will make sure patient is also on heated high flow with goal of >89% and will have respiratory come by. He does appear to have slight increase in volume today. With findings no cxr will try to optimize respiratory status with diuresis.   - lasix 20 mg IV once - strict I/O, daily weights  - tussionex q12h will change to scheduled, cont robitussen q4h prn - tessalon perles - increased to TID - remdesivir day 3/5 -  decadron day 3 - cont. Baricitinib, day 2 - cont. IS, flutter valve - appreciate respiratory therapy support  - trend inflammatory labs  VTE: eliquis IVF: none Diet: HH Code: full  Dispo: Anticipated discharge in approximately pending clinical improvement.   Clair Bardwell A, DO 09/12/2019, 7:29 AM Pager: [phone] After 5pm on weekdays and 1pm on weekends: On Call Pager: [phone]

## 2019-09-13 DIAGNOSIS — J9601 Acute respiratory failure with hypoxia: Secondary | ICD-10-CM | POA: Diagnosis not present

## 2019-09-13 DIAGNOSIS — U071 COVID-19: Secondary | ICD-10-CM | POA: Diagnosis not present

## 2019-09-13 LAB — D-DIMER, QUANTITATIVE: D-Dimer, Quant: >20 ug/mL-FEU — ABNORMAL HIGH (ref 0.00–0.50)

## 2019-09-13 LAB — CBC WITH DIFFERENTIAL/PLATELET
Abs Immature Granulocytes: 0.04 10*3/uL (ref 0.00–0.07)
Basophils Absolute: 0 10*3/uL (ref 0.0–0.1)
Basophils Relative: 0 %
Eosinophils Absolute: 0 10*3/uL (ref 0.0–0.5)
Eosinophils Relative: 0 %
HCT: 46.6 % (ref 39.0–52.0)
Hemoglobin: 15.6 g/dL (ref 13.0–17.0)
Immature Granulocytes: 1 %
Lymphocytes Relative: 7 %
Lymphs Abs: 0.5 10*3/uL — ABNORMAL LOW (ref 0.7–4.0)
MCH: 31.6 pg (ref 26.0–34.0)
MCHC: 33.5 g/dL (ref 30.0–36.0)
MCV: 94.3 fL (ref 80.0–100.0)
Monocytes Absolute: 0.3 10*3/uL (ref 0.1–1.0)
Monocytes Relative: 4 %
Neutro Abs: 6.6 10*3/uL (ref 1.7–7.7)
Neutrophils Relative %: 88 %
Platelets: 175 10*3/uL (ref 150–400)
RBC: 4.94 MIL/uL (ref 4.22–5.81)
RDW: 12 % (ref 11.5–15.5)
WBC: 7.5 10*3/uL (ref 4.0–10.5)
nRBC: 0 % (ref 0.0–0.2)

## 2019-09-13 LAB — COMPREHENSIVE METABOLIC PANEL
ALT: 96 U/L — ABNORMAL HIGH (ref 0–44)
AST: 56 U/L — ABNORMAL HIGH (ref 15–41)
Albumin: 3.3 g/dL — ABNORMAL LOW (ref 3.5–5.0)
Alkaline Phosphatase: 73 U/L (ref 38–126)
Anion gap: 10 (ref 5–15)
BUN: 22 mg/dL — ABNORMAL HIGH (ref 6–20)
CO2: 23 mmol/L (ref 22–32)
Calcium: 8.8 mg/dL — ABNORMAL LOW (ref 8.9–10.3)
Chloride: 102 mmol/L (ref 98–111)
Creatinine, Ser: 1.17 mg/dL (ref 0.61–1.24)
GFR calc Af Amer: >60 mL/min (ref >60)
GFR calc non Af Amer: >60 mL/min (ref >60)
Glucose, Bld: 219 mg/dL — ABNORMAL HIGH (ref 70–99)
Potassium: 4.3 mmol/L (ref 3.5–5.1)
Sodium: 135 mmol/L (ref 135–145)
Total Bilirubin: 1.5 mg/dL — ABNORMAL HIGH (ref 0.3–1.2)
Total Protein: 6.9 g/dL (ref 6.5–8.1)

## 2019-09-13 LAB — MAGNESIUM: Magnesium: 2 mg/dL (ref 1.7–2.4)

## 2019-09-13 LAB — C-REACTIVE PROTEIN: CRP: 1.3 mg/dL — ABNORMAL HIGH (ref <1.0)

## 2019-09-13 LAB — GLUCOSE, CAPILLARY: Glucose-Capillary: 122 mg/dL — ABNORMAL HIGH (ref 70–99)

## 2019-09-13 MED ORDER — LIP MEDEX EX OINT
1.0000 "application " | TOPICAL_OINTMENT | CUTANEOUS | Status: DC | PRN
  Filled 2019-09-13 (×2): qty 7

## 2019-09-13 NOTE — Progress Notes (Addendum)
   Subjective:   He states breathing is slightly improved with addition of the heated high flow, but he continues to have cough which causes pain. He had a BM this morning, no diarrhea.    Objective:  Vital signs in last 24 hours: Vitals:   09/13/19 0203 09/13/19 0432 09/13/19 0435 09/13/19 0605  BP:  133/83    Pulse: 63 61    Resp: (!) 24 (!) 30 (!) 25   Temp:  98.2 F (36.8 C)    TempSrc:  Oral    SpO2: 96% 94%    Weight:    110.2 kg  Height:        Supplemental Oxygen: 40L NRB + HFNC  Constitution: sitting up in chair, acutely ill appearing  Cardio: RRR, no m/r/g, no LE edema  Respiratory: labored breathing, clear breath sounds Abdominal: NTTP, soft, non-distended Neuro: normal affect, alert & oriented Skin: c/d/i    Assessment/Plan:  Principal Problem:   COVID-19 Active Problems:   Acute respiratory failure with hypoxia (HCC)  54yo amle with PMH of PE on eliquis, OSA, GERD, and obesity presenting with covid-19 pneumonia, first diagnosed 8/26, admitted with worsening shortness of breath and new oxygen requirement.   COVID-19 Hospital day 4. Improvement in oxygen yesterday with addition of heated high flow although still very ill appearing with labored breathing. Diuresed 2L yesterday with 20 IV lasix and good urine output, appears euvolemic today. Labs todya still pending.   - strict I/O, daily weights  - cont scheduled tussionex, cont robitussen q4h prn, tylenol prn - tessalon perles TID - remdesivir day 4/5 - decadron day 4 - cont. Baricitinib, day 3 - cont. IS, flutter valve - appreciate respiratory therapy support  - trend inflammatory labs - likely PT tomorrow   VTE: eliquis IVF: none Diet: HH Code: full  Dispo: Anticipated discharge in approximately pending clinical improvement.   Samuel Peterson A, DO 09/13/2019, 7:12 AM Pager: [phone] After 5pm on weekdays and 1pm on weekends: On Call Pager: [phone]

## 2019-09-14 DIAGNOSIS — U071 COVID-19: Secondary | ICD-10-CM | POA: Diagnosis not present

## 2019-09-14 DIAGNOSIS — J9601 Acute respiratory failure with hypoxia: Secondary | ICD-10-CM

## 2019-09-14 LAB — COMPREHENSIVE METABOLIC PANEL
ALT: 83 U/L — ABNORMAL HIGH (ref 0–44)
AST: 45 U/L — ABNORMAL HIGH (ref 15–41)
Albumin: 3.3 g/dL — ABNORMAL LOW (ref 3.5–5.0)
Alkaline Phosphatase: 73 U/L (ref 38–126)
Anion gap: 11 (ref 5–15)
BUN: 21 mg/dL — ABNORMAL HIGH (ref 6–20)
CO2: 27 mmol/L (ref 22–32)
Calcium: 8.8 mg/dL — ABNORMAL LOW (ref 8.9–10.3)
Chloride: 99 mmol/L (ref 98–111)
Creatinine, Ser: 1.01 mg/dL (ref 0.61–1.24)
GFR calc Af Amer: >60 mL/min (ref >60)
GFR calc non Af Amer: >60 mL/min (ref >60)
Glucose, Bld: 141 mg/dL — ABNORMAL HIGH (ref 70–99)
Potassium: 4 mmol/L (ref 3.5–5.1)
Sodium: 137 mmol/L (ref 135–145)
Total Bilirubin: 1.9 mg/dL — ABNORMAL HIGH (ref 0.3–1.2)
Total Protein: 6.6 g/dL (ref 6.5–8.1)

## 2019-09-14 LAB — CBC WITH DIFFERENTIAL/PLATELET
Abs Immature Granulocytes: 0 10*3/uL (ref 0.00–0.07)
Basophils Absolute: 0 10*3/uL (ref 0.0–0.1)
Basophils Relative: 0 %
Eosinophils Absolute: 0 10*3/uL (ref 0.0–0.5)
Eosinophils Relative: 0 %
HCT: 46.2 % (ref 39.0–52.0)
Hemoglobin: 15.4 g/dL (ref 13.0–17.0)
Lymphocytes Relative: 16 %
Lymphs Abs: 1.4 10*3/uL (ref 0.7–4.0)
MCH: 31.4 pg (ref 26.0–34.0)
MCHC: 33.3 g/dL (ref 30.0–36.0)
MCV: 94.3 fL (ref 80.0–100.0)
Monocytes Absolute: 0.9 10*3/uL (ref 0.1–1.0)
Monocytes Relative: 10 %
Neutro Abs: 6.5 10*3/uL (ref 1.7–7.7)
Neutrophils Relative %: 74 %
Platelets: 158 10*3/uL (ref 150–400)
RBC: 4.9 MIL/uL (ref 4.22–5.81)
RDW: 12 % (ref 11.5–15.5)
WBC: 8.8 10*3/uL (ref 4.0–10.5)
nRBC: 0 % (ref 0.0–0.2)

## 2019-09-14 LAB — BLOOD GAS, ARTERIAL
Acid-Base Excess: 0.5 mmol/L (ref 0.0–2.0)
Bicarbonate: 24.1 mmol/L (ref 20.0–28.0)
FIO2: 100
O2 Saturation: 90.5 %
Patient temperature: 36.7
pCO2 arterial: 35.1 mmHg (ref 32.0–48.0)
pH, Arterial: 7.45 (ref 7.350–7.450)
pO2, Arterial: 59.3 mmHg — ABNORMAL LOW (ref 83.0–108.0)

## 2019-09-14 LAB — C-REACTIVE PROTEIN: CRP: 1.9 mg/dL — ABNORMAL HIGH (ref <1.0)

## 2019-09-14 LAB — D-DIMER, QUANTITATIVE: D-Dimer, Quant: >20 ug/mL-FEU — ABNORMAL HIGH (ref 0.00–0.50)

## 2019-09-14 MED ORDER — ALBUTEROL SULFATE HFA 108 (90 BASE) MCG/ACT IN AERS
1.0000 | INHALATION_SPRAY | Freq: Four times a day (QID) | RESPIRATORY_TRACT | Status: DC
  Administered 2019-09-14 – 2019-09-17 (×12): 2 via RESPIRATORY_TRACT
  Filled 2019-09-14: qty 6.7

## 2019-09-14 NOTE — Progress Notes (Signed)
   Subjective:   Continues to have shortness of breath and cough, feeling very tired as he is unable to sleep. He states he has tried getting in the bed but only becomes more short of breath and persists in staying in the chair. He has pain in his chest when he coughs but otherwise does not have any pain.   He denies nausea, diarrhea, dizziness, chills.   Objective:  Vital signs in last 24 hours: Vitals:   09/13/19 2012 09/13/19 2328 09/14/19 0257 09/14/19 0336  BP:  (!) 141/84  133/89  Pulse: 94 67 77 82  Resp: (!) 24 (!) 22 (!) 24 (!) 25  Temp:  98.9 F (37.2 C)  98.7 F (37.1 C)  TempSrc:  Axillary  Axillary  SpO2: 94% 99% 92% 93%  Weight:      Height:        Supplemental Oxygen: 50L NRB + HFNC  Constitution: acutely ill appearing, sitting up in chair Cardio: slightly tachy, regular rhythm, no murmur or gallop, no LE edema Respiratory: decreased breath sounds, tachypnic w/labored breathing Abdominal: NTTP, non-distended Neuro: alert and oriented, normal affect  Skin: diaphoretic, otherwise c/d/i   Assessment/Plan:  Principal Problem:   COVID-19 Active Problems:   Acute respiratory failure with hypoxia [facility])  54yo male with PMH of PE on eliquis with recurrent respiratory infections not requiring hospitalization, OSA, GERD, and obesity presenting with covid-19 pneumonia, first diagnosed 8/26, admitted with worsening shortness of breath and new oxygen requirement.   COVID-19 Hospital day 5. Oxygen requirement unfortunately continuing to increase, now on 50L total with NRB & HFNC. D-dimer today is >20 but other inflammatory labs are stable. He is one day 5 of remdesivir and day 4 baricitinib. Previously provided with albuterol but refused to use this as it made his cough worse, will try this again. With worsening of respiratory status, will discuss with PCCM as well.   - with hx of PE and d-dimer now >20, CTA ordered if he is able to lay flat  - PCCM consulted, appreciate  recommendations - strict I/O, daily weights  - cont scheduled tussionex, cont robitussen q4h prn, tylenol prn - tessalon perles TID - remdesivir day 5/5 - decadron day 5 - cont. Baricitinib, day 4 - cont. IS, flutter valve - appreciate respiratory therapy support  - trend inflammatory labs  History of PE - continue eliquis   VTE: eliquis IVF: none Diet: HH Code: full  Dispo: Anticipated discharge in approximately pending clinical improvement.   Barbie Croston A, DO 09/14/2019, 7:21 AM Pager: [phone] After 5pm on weekdays and 1pm on weekends: On Call Pager: [phone]

## 2019-09-14 NOTE — Progress Notes (Signed)
Came by to see this patient because according to the LPN taking care of him he is requiring more oxygen to get his O2 saturation up.  I assessed the patient and he was sitting up in his chair maintaining an O2 reading of 90. The patient was AOx4 but was pretty sleepy.  I will let the pm RR RN know about this patient and to keep him on their radar.

## 2019-09-14 NOTE — Progress Notes (Addendum)
Paged for patient have desaturations as low as 73% and maintaining around the lower 80s with a nose bleed. On visiting patient's room he is currently on NRB 15L only due to nosebleed, maintaining around 87% with respirations around 30. Nose bleed slowed and almost stopped. He endorses ongoing shortness of breath unchanged from this morning and feels ok at this time. Discussed that he can keep the HFNC off for a bit while nose bleed stops with goal of saturations remaining above 85% and that he should resume heated HF once he is able or if saturations are consistently <85%. Will obtain ABG and let PCCM know as well.   Versie Starks, DO 09/14/2019, 6:53 PM Pager: [phone]

## 2019-09-14 NOTE — Progress Notes (Signed)
Called the Dr, bc his 02 levels stayed in the low 70's for about 2-3 minutes and then at 80 for about 10 mins after is 02 was changed from 30 L to 40L on the heated high flow. Also had a nose bleed. Rapid response was made aware and I had my CN Josh assist me with assessing pt.

## 2019-09-14 NOTE — Consult Note (Signed)
NAME:  Samuel Peterson, MRN:  [id number], DOB:  1965-10-22, LOS: 4 ADMISSION DATE:  Sep 28, 2019, CONSULTATION DATE:  09/14/2019 REFERRING MD: Sandre Kitty , CHIEF COMPLAINT:  COVID -19 pneumonia   HPI/course in hospital  Called for worsening hypoxia with ambulation today.  Past Medical History   Past Medical History:  Diagnosis Date  . Pulmonary emboli (HCC) 2018  . Sleep apnea    Uses CPAP at night     Past Surgical History:  Procedure Laterality Date  . CARPAL TUNNEL RELEASE    . LAMINECTOMY    . NASAL SEPTUM SURGERY    . NASAL SINUS SURGERY    . NECK SURGERY       Review of Systems:   Review of Systems  All other systems reviewed and are negative.   Social History   reports that he has quit smoking. He has never used smokeless tobacco.   Family History   His family history is not on file.   Allergies No Known Allergies   Home Medications  Prior to Admission medications   Medication Sig Start Date End Date Taking? Authorizing Provider  azithromycin (ZITHROMAX) 500 MG tablet Take 500 mg by mouth daily. 3 day course 09/09/19  Yes [provider]  benzonatate (TESSALON) 100 MG capsule Take 100 mg by mouth 3 (three) times daily as needed for cough.  09/06/19  Yes [provider]  dexamethasone (DECADRON) 2 MG tablet Take 6 mg by mouth daily. 5 day course 09/06/19  Yes [provider]  DM-Acetaminophen-Triprolidine (MUCINEX NIGHT COLD/FLU MAX STR) 10-325-1.25 MG TABS Take 2 tablets by mouth at bedtime as needed (cold and cough).   Yes [provider]  ELIQUIS 5 MG TABS tablet Take 5 mg by mouth 2 (two) times daily. 06/06/19  Yes [provider]  latanoprost (XALATAN) 0.005 % ophthalmic solution Place 1 drop into both eyes at bedtime. 01/29/16  Yes [provider]  pantoprazole (PROTONIX) 40 MG tablet Take 40 mg by mouth 2 (two) times daily. 04/12/19  Yes [provider]  Phenylephrine-DM-GG-APAP (MUCINEX FREEFROM COLD/FLU  DAY PO) Take 2 tablets by mouth 2 (two) times daily as needed (cough and cold).   Yes [provider]  predniSONE (DELTASONE) 20 MG tablet Take by mouth as directed. 12 day course dosepack 09/09/19  Yes [provider]     Interim history/subjective:  Dyspnea improving at rest.  Objective   Blood pressure 108/89, pulse 98, temperature 98.1 F (36.7 C), resp. rate (!) 27, height 5' 9.5" (1.765 m), weight 110.2 kg, SpO2 95 %.    FiO2 (%):  [90 %-100 %] 90 %   Intake/Output Summary (Last 24 hours) at 09/14/2019 1903 Last data filed at 09/14/2019 0211 Gross per 24 hour  Intake --  Output 700 ml  Net -700 ml   Filed Weights   09/28/2019 0933 09/11/19 1140 09/13/19 0605  Weight: 111.1 kg 113.4 kg 110.2 kg    Examination: Physical Exam Constitutional:      General: He is in acute distress.     Appearance: He is obese. He is ill-appearing and diaphoretic.  HENT:     Mouth/Throat:     Mouth: Mucous membranes are dry.  Eyes:     Pupils: Pupils are equal, round, and reactive to light.  Cardiovascular:     Rate and Rhythm: Tachycardia present.     Heart sounds: Normal heart sounds.  Pulmonary:     Breath sounds: Normal breath sounds.  Abdominal:  General: Abdomen is flat.  Skin:    General: Skin is warm.     Capillary Refill: Capillary refill takes 2 to 3 seconds.  Neurological:     General: No focal deficit present.     Mental Status: He is oriented to person, place, and time.      Ancillary tests (personally reviewed)  CBC: Recent Labs  Lab 10/04/19 0956 2019/10/04 0956 10/04/2019 1059 09/11/19 0459 09/12/19 1355 09/13/19 1622 09/14/19 0130  WBC 7.3  --   --  7.5 8.0 7.5 8.8  NEUTROABS 6.4  --   --  5.7 6.0 6.6 6.5  HGB 16.1   < > 15.3 16.3 16.7 15.6 15.4  HCT 47.7   < > 45.0 48.8 49.4 46.6 46.2  MCV 96.6  --   --  98.4 95.4 94.3 94.3  PLT 188  --   --  191 235 175 158   < > = values in this interval not displayed.    Basic Metabolic  Panel: Recent Labs  Lab October 04, 2019 0956 10-04-19 0956 10-04-19 1059 09/11/19 0459 09/12/19 1355 09/13/19 1622 09/14/19 0130  NA 139   < > 140 137 139 135 137  K 3.9   < > 3.7 3.9 3.7 4.3 4.0  CL 103  --   --  100 102 102 99  CO2 23  --   --  22 29 23 27   GLUCOSE 131*  --   --  107* 122* 219* 141*  BUN 16  --   --  18 25* 22* 21*  CREATININE 1.18  --   --  1.07 1.11 1.17 1.01  CALCIUM 8.8*  --   --  8.8* 8.7* 8.8* 8.8*  MG  --   --   --   --  2.0 2.0  --   PHOS  --   --   --   --  3.4  --   --    < > = values in this interval not displayed.   GFR: Estimated Creatinine Clearance: 104.3 mL/min (by C-G formula based on SCr of 1.01 mg/dL). Recent Labs  Lab October 04, 2019 0956 10-04-19 1036 Oct 04, 2019 1500 2019/10/04 1522 09/11/19 0459 09/12/19 1355 09/13/19 1622 09/14/19 0130  PROCALCITON  --   --   --  0.22  --   --   --   --   WBC   < >  --   --   --  7.5 8.0 7.5 8.8  LATICACIDVEN  --  1.9 1.4  --   --   --   --   --    < > = values in this interval not displayed.    Liver Function Tests: Recent Labs  Lab 2019-10-04 0956 09/11/19 0459 09/12/19 1355 09/13/19 1622 09/14/19 0130  AST 58* 56* 70* 56* 45*  ALT 58* 51* 71* 96* 83*  ALKPHOS 47 52 64 73 73  BILITOT 0.7 0.8 1.2 1.5* 1.9*  PROT 7.0 7.5 7.4 6.9 6.6  ALBUMIN 3.7 3.4* 3.7 3.3* 3.3*   No results for input(s): LIPASE, AMYLASE in the last 168 hours. No results for input(s): AMMONIA in the last 168 hours.  ABG    Component Value Date/Time   PHART 7.450 09/14/2019 1646   PCO2ART 35.1 09/14/2019 1646   PO2ART 59.3 (L) 09/14/2019 1646   HCO3 24.1 09/14/2019 1646   TCO2 23 10-04-2019 1059   ACIDBASEDEF 2.0 10-04-2019 1059   O2SAT 90.5 09/14/2019 1646     Coagulation Profile:  No results for input(s): INR, PROTIME in the last 168 hours.  Cardiac Enzymes: No results for input(s): CKTOTAL, CKMB, CKMBINDEX, TROPONINI in the last 168 hours.  HbA1C: No results found for: HGBA1C  CBG: Recent Labs  Lab  09/12/19 2019 09/13/19 0011  GLUCAP 170* 122*     Assessment & Plan:   Acute hypoxic respiratory failure due to COVID -19 pneumonia.  While oxygen requirements are expected to fluctuate with exertion, not advantage to early intubation.  Continue HHFNC and tolerate saturations to 87% or above.  Intubate for increased respiratory distress.   Please call if deteriorates.    Lynnell Catalan, MD [facility] ICU Physician [facility]  Pager: [phone] Mobile: [phone] After hours: [phone].  09/14/2019, 7:03 PM

## 2019-09-15 DIAGNOSIS — J9601 Acute respiratory failure with hypoxia: Secondary | ICD-10-CM | POA: Diagnosis not present

## 2019-09-15 DIAGNOSIS — U071 COVID-19: Secondary | ICD-10-CM | POA: Diagnosis not present

## 2019-09-15 LAB — COMPREHENSIVE METABOLIC PANEL
ALT: 79 U/L — ABNORMAL HIGH (ref 0–44)
AST: 40 U/L (ref 15–41)
Albumin: 3.3 g/dL — ABNORMAL LOW (ref 3.5–5.0)
Alkaline Phosphatase: 83 U/L (ref 38–126)
Anion gap: 10 (ref 5–15)
BUN: 18 mg/dL (ref 6–20)
CO2: 30 mmol/L (ref 22–32)
Calcium: 8.9 mg/dL (ref 8.9–10.3)
Chloride: 100 mmol/L (ref 98–111)
Creatinine, Ser: 0.97 mg/dL (ref 0.61–1.24)
GFR calc Af Amer: >60 mL/min (ref >60)
GFR calc non Af Amer: >60 mL/min (ref >60)
Glucose, Bld: 117 mg/dL — ABNORMAL HIGH (ref 70–99)
Potassium: 4.1 mmol/L (ref 3.5–5.1)
Sodium: 140 mmol/L (ref 135–145)
Total Bilirubin: 1.8 mg/dL — ABNORMAL HIGH (ref 0.3–1.2)
Total Protein: 6.8 g/dL (ref 6.5–8.1)

## 2019-09-15 LAB — CULTURE, BLOOD (ROUTINE X 2)
Culture: NO GROWTH
Culture: NO GROWTH
Special Requests: ADEQUATE
Special Requests: ADEQUATE

## 2019-09-15 LAB — CBC WITH DIFFERENTIAL/PLATELET
Abs Immature Granulocytes: 0.1 10*3/uL — ABNORMAL HIGH (ref 0.00–0.07)
Basophils Absolute: 0 10*3/uL (ref 0.0–0.1)
Basophils Relative: 0 %
Eosinophils Absolute: 0 10*3/uL (ref 0.0–0.5)
Eosinophils Relative: 0 %
HCT: 46.4 % (ref 39.0–52.0)
Hemoglobin: 15.7 g/dL (ref 13.0–17.0)
Immature Granulocytes: 1 %
Lymphocytes Relative: 9 %
Lymphs Abs: 1.3 10*3/uL (ref 0.7–4.0)
MCH: 31.7 pg (ref 26.0–34.0)
MCHC: 33.8 g/dL (ref 30.0–36.0)
MCV: 93.7 fL (ref 80.0–100.0)
Monocytes Absolute: 1.2 10*3/uL — ABNORMAL HIGH (ref 0.1–1.0)
Monocytes Relative: 9 %
Neutro Abs: 11.2 10*3/uL — ABNORMAL HIGH (ref 1.7–7.7)
Neutrophils Relative %: 81 %
Platelets: 139 10*3/uL — ABNORMAL LOW (ref 150–400)
RBC: 4.95 MIL/uL (ref 4.22–5.81)
RDW: 12 % (ref 11.5–15.5)
WBC: 13.7 10*3/uL — ABNORMAL HIGH (ref 4.0–10.5)
nRBC: 0 % (ref 0.0–0.2)

## 2019-09-15 LAB — C-REACTIVE PROTEIN: CRP: 3 mg/dL — ABNORMAL HIGH (ref <1.0)

## 2019-09-15 LAB — D-DIMER, QUANTITATIVE: D-Dimer, Quant: >20 ug/mL-FEU — ABNORMAL HIGH (ref 0.00–0.50)

## 2019-09-15 LAB — FERRITIN: Ferritin: 1192 ng/mL — ABNORMAL HIGH (ref 24–336)

## 2019-09-15 NOTE — Progress Notes (Signed)
   Subjective:   Feeling tired today as he still hasn't slept much and thinks he can try to lay in the bed today. Cough improved unless he moves around. He is urinating well, has been trying to eat when he can.   Objective:  Vital signs in last 24 hours: Vitals:   09/14/19 2027 09/14/19 2300 09/15/19 0025 09/15/19 0300  BP:  137/89  133/85  Pulse: 94 80 90 82  Resp: (!) 37 (!) 23 19 20   Temp:  97.7 F (36.5 C)  97.7 F (36.5 C)  TempSrc:  Axillary  Axillary  SpO2: 92% 92% 96% 96%  Weight:      Height:        Supplemental Oxygen: 50L NRB + HFNC 90%  Constitution: acutely ill appearing, laying back in chair Cardio: RRR, no murmur or gallop, no LE edema Respiratory: decreased breath sounds, tachypnic w/labored breathing, no crackles, wheezing, or rales Abdominal: NTTP, non-distended Neuro: alert and oriented, tired affect Skin: c/d/i   Assessment/Plan:  Principal Problem:   COVID-19 Active Problems:   Acute respiratory failure with hypoxia [facility])  54yo male with PMH of PE on eliquis with recurrent respiratory infections not requiring hospitalization, OSA, GERD, and obesity presenting with covid-19 pneumonia, first diagnosed 8/26, admitted with worsening shortness of breath and new oxygen requirement.   COVID-19 Hospital day 6. PCCM consulted yesterday with patient maintaining sats in the low 80s on 50L, although he actually improved throughout the day and was in the 90s in the evening. Remains tenuous as he is around 88% still requiring FiO2 100% 40-50L heat HFNC and NRB. D-dimer >20, other inflammatory labs elevated, LFTs improving. ABG done yesterday with slight hypoxia and normal pH.  - CTA ordered yesterday but unable to lay flat, will obtain when able - goal O2 >87% - strict I/O, daily weights  - cont scheduled tussionex, cont robitussen q4h prn, tylenol prn - cont. tessalon perles TID - completed remdesivir 9/4 - decadron day 6 - cont. Baricitinib, day 5 - cont.  IS, flutter valve - appreciate respiratory therapy support  - trend inflammatory labs - PT once patient improves  History of PE - continue eliquis   VTE: eliquis IVF: none Diet: HH Code: full  Dispo: Anticipated discharge in approximately pending clinical improvement.   Joni Colegrove A, DO 09/15/2019, 7:18 AM Pager: [phone] After 5pm on weekdays and 1pm on weekends: On Call Pager: [phone]

## 2019-09-16 ENCOUNTER — Inpatient Hospital Stay (HOSPITAL_COMMUNITY): Payer: Federal, State, Local not specified - PPO

## 2019-09-16 DIAGNOSIS — J9601 Acute respiratory failure with hypoxia: Secondary | ICD-10-CM | POA: Diagnosis not present

## 2019-09-16 DIAGNOSIS — U071 COVID-19: Secondary | ICD-10-CM | POA: Diagnosis not present

## 2019-09-16 DIAGNOSIS — R0602 Shortness of breath: Secondary | ICD-10-CM | POA: Diagnosis not present

## 2019-09-16 LAB — COMPREHENSIVE METABOLIC PANEL
ALT: 64 U/L — ABNORMAL HIGH (ref 0–44)
AST: 36 U/L (ref 15–41)
Albumin: 3.1 g/dL — ABNORMAL LOW (ref 3.5–5.0)
Alkaline Phosphatase: 80 U/L (ref 38–126)
Anion gap: 10 (ref 5–15)
BUN: 23 mg/dL — ABNORMAL HIGH (ref 6–20)
CO2: 27 mmol/L (ref 22–32)
Calcium: 8.7 mg/dL — ABNORMAL LOW (ref 8.9–10.3)
Chloride: 100 mmol/L (ref 98–111)
Creatinine, Ser: 1.15 mg/dL (ref 0.61–1.24)
GFR calc Af Amer: >60 mL/min (ref >60)
GFR calc non Af Amer: >60 mL/min (ref >60)
Glucose, Bld: 155 mg/dL — ABNORMAL HIGH (ref 70–99)
Potassium: 4.4 mmol/L (ref 3.5–5.1)
Sodium: 137 mmol/L (ref 135–145)
Total Bilirubin: 1.5 mg/dL — ABNORMAL HIGH (ref 0.3–1.2)
Total Protein: 6.3 g/dL — ABNORMAL LOW (ref 6.5–8.1)

## 2019-09-16 LAB — CBC
HCT: 44.9 % (ref 39.0–52.0)
Hemoglobin: 15.5 g/dL (ref 13.0–17.0)
MCH: 32.5 pg (ref 26.0–34.0)
MCHC: 34.5 g/dL (ref 30.0–36.0)
MCV: 94.1 fL (ref 80.0–100.0)
Platelets: 125 10*3/uL — ABNORMAL LOW (ref 150–400)
RBC: 4.77 MIL/uL (ref 4.22–5.81)
RDW: 11.9 % (ref 11.5–15.5)
WBC: 17.3 10*3/uL — ABNORMAL HIGH (ref 4.0–10.5)
nRBC: 0 % (ref 0.0–0.2)

## 2019-09-16 LAB — D-DIMER, QUANTITATIVE: D-Dimer, Quant: >20 ug/mL-FEU — ABNORMAL HIGH (ref 0.00–0.50)

## 2019-09-16 LAB — FERRITIN: Ferritin: 1065 ng/mL — ABNORMAL HIGH (ref 24–336)

## 2019-09-16 LAB — C-REACTIVE PROTEIN: CRP: 4.7 mg/dL — ABNORMAL HIGH (ref <1.0)

## 2019-09-16 LAB — PROCALCITONIN: Procalcitonin: <0.1 ng/mL

## 2019-09-16 LAB — MAGNESIUM: Magnesium: 2.2 mg/dL (ref 1.7–2.4)

## 2019-09-16 LAB — MRSA PCR SCREENING: MRSA by PCR: NEGATIVE

## 2019-09-16 MED ORDER — IOHEXOL 350 MG/ML SOLN
75.0000 mL | Freq: Once | INTRAVENOUS | Status: AC | PRN
  Administered 2019-09-16: 75 mL via INTRAVENOUS

## 2019-09-16 MED ORDER — IOHEXOL 350 MG/ML SOLN: 75.0000 mL | Freq: Once | INTRAVENOUS | Status: DC | PRN

## 2019-09-16 MED ORDER — CHLORHEXIDINE GLUCONATE CLOTH 2 % EX PADS
6.0000 | MEDICATED_PAD | Freq: Every day | CUTANEOUS | Status: DC
  Administered 2019-09-16 – 2019-09-29 (×14): 6 via TOPICAL

## 2019-09-16 NOTE — Progress Notes (Signed)
  Date: 09/16/2019  Patient name: Samuel Peterson  Medical record number: [id number]  Date of birth: [birth year]        I have seen and evaluated this patient and I have discussed the plan of care with the house staff. Please see Dr. Al Decant note for complete details. I concur with her findings and plan.    Inez Catalina, MD 09/16/2019, 3:08 PM

## 2019-09-16 NOTE — Progress Notes (Signed)
Patient called out saying he couldn't catch his breath. On assessment, he is dyspneic RR in 30s, his sats are 76%, he is proned. I encourage him to cough and deep breathe but cannot get his sats above 80%. I increased oxygen supply to 50L/100% + NRB at 15L. His sats improved to about 86% and work of breathing calmed some. He is still tachypneic and sats remain in upper 80s. He has been unable to recover any more than that for about an hour now. MDs aware of this and considering transfer to ICU.

## 2019-09-16 NOTE — Progress Notes (Signed)
Patient's wife called and updated on his condition. All questions and concerns were addressed.   Lenward Chancellor D, DO 09/16/19, 1:40 PM

## 2019-09-16 NOTE — Progress Notes (Addendum)
NAME:  Samuel Peterson, MRN:  [id number], DOB:  09-16-1965, LOS: 6 ADMISSION DATE:  September 25, 2019, CONSULTATION DATE:  09/14/19 REFERRING MD:  Criselda Peaches, CHIEF COMPLAINT:  COVID-19   Brief History   54yo male with PMH of PE on eliquis and recurrent respiratory infections not requiring hospitalization presenting with covid-19 pneumonia, first diagnosed 8/26, admitted with worsening shortness of breath and new oxygen requirement.   History of present illness   PCCM consulted on 9/4 for worsening hypoxia with ambulation. Was maintaining O2 sats and breathing comfortably on HHFNC. No indication for intubation at that time. Re-consulted 09/16/19 for increased work of breathing and oxygen requirement.   Past Medical History  PE OSA on CPAP  Significant Hospital Events    8/31>admitted 9/6>ICU transfer   Consults:  none  Procedures:  none  Significant Diagnostic Tests:  8/31 CXR> diffuse bilateral interstitial opacities   Micro Data:  8/31 Blood cultures x2 >>NGTD   Antimicrobials:    Interim history/subjective:  Endorses increased shortness of breath. Felt a little better proning overnight. Using suction catheter intermittently for productive cough. Feels tired.   Objective   Blood pressure 124/82, pulse 100, temperature 97.8 F (36.6 C), temperature source Axillary, resp. rate 20, height 5' 9.5" (1.765 m), weight 110.2 kg, SpO2 (!) 89 %.    FiO2 (%):  [90 %-100 %] 100 %   Intake/Output Summary (Last 24 hours) at 09/16/2019 1259 Last data filed at 09/16/2019 0300 Gross per 24 hour  Intake --  Output 200 ml  Net -200 ml   Filed Weights   09/25/2019 0933 09/11/19 1140 09/13/19 0605  Weight: 111.1 kg 113.4 kg 110.2 kg    Examination: General: awake, alert, in mild respiratory distress  HENT: HHFNC and non-rebreather in place  Lungs: increased work of breathing, tachypneic with some accessory muscle use; able to speak in complete sentences; no wheezes or rhonchi  Cardiovascular:  RRR Abdomen: soft, non-distended, non-tender Extremities: no edema  Neuro: A&Ox4  Resolved Hospital Problem list     Assessment & Plan:  Acute hypoxic respiratory failure due to COVID-19 Pneumonia -patient has had increased oxygen requirement from 40-65 L with HHFNC and NRB -he is now experiencing increased work of breathing and is at high risk for requiring intubation -transfer to ICU for closer monitoring -continue HHFNC and NRB for goal O2 sat >92% -self-prone as able -completed course of remdesivir -continue decadron and baricitinib  -IS -leukocytosis trending up, but no fever or other infectious symptoms. Procal not elevated. Will hold of on addition of antibiotics; likely secondary to steroid course   History of PE -continue Eliquis   Best practice:  Diet: HH DVT prophylaxis: Eliquis  Code Status: Full Family Communication: Will call and update wife  Disposition: ICU  Labs   CBC: Recent Labs  Lab 09/11/19 0459 09/11/19 0459 09/12/19 1355 09/13/19 1622 09/14/19 0130 09/15/19 0702 09/16/19 0118  WBC 7.5   < > 8.0 7.5 8.8 13.7* 17.3*  NEUTROABS 5.7  --  6.0 6.6 6.5 11.2*  --   HGB 16.3   < > 16.7 15.6 15.4 15.7 15.5  HCT 48.8   < > 49.4 46.6 46.2 46.4 44.9  MCV 98.4   < > 95.4 94.3 94.3 93.7 94.1  PLT 191   < > 235 175 158 139* 125*   < > = values in this interval not displayed.    Basic Metabolic Panel: Recent Labs  Lab 09/12/19 1355 09/13/19 1622 09/14/19 0130 09/15/19 7829 09/16/19 0118  NA 139 135 137 140 137  K 3.7 4.3 4.0 4.1 4.4  CL 102 102 99 100 100  CO2 29 23 27 30 27   GLUCOSE 122* 219* 141* 117* 155*  BUN 25* 22* 21* 18 23*  CREATININE 1.11 1.17 1.01 0.97 1.15  CALCIUM 8.7* 8.8* 8.8* 8.9 8.7*  MG 2.0 2.0  --   --  2.2  PHOS 3.4  --   --   --   --    GFR: Estimated Creatinine Clearance: 91.6 mL/min (by C-G formula based on SCr of 1.15 mg/dL). Recent Labs  Lab 09/05/2019 1036 09/04/2019 1500 08/20/2019 1522 09/11/19 0459 09/13/19 1622  09/14/19 0130 09/15/19 0702 09/16/19 0118 09/16/19 0759  PROCALCITON  --   --  0.22  --   --   --   --   --  <0.10  WBC  --   --   --    < > 7.5 8.8 13.7* 17.3*  --   LATICACIDVEN 1.9 1.4  --   --   --   --   --   --   --    < > = values in this interval not displayed.    Liver Function Tests: Recent Labs  Lab 09/12/19 1355 09/13/19 1622 09/14/19 0130 09/15/19 0702 09/16/19 0118  AST 70* 56* 45* 40 36  ALT 71* 96* 83* 79* 64*  ALKPHOS 64 73 73 83 80  BILITOT 1.2 1.5* 1.9* 1.8* 1.5*  PROT 7.4 6.9 6.6 6.8 6.3*  ALBUMIN 3.7 3.3* 3.3* 3.3* 3.1*   No results for input(s): LIPASE, AMYLASE in the last 168 hours. No results for input(s): AMMONIA in the last 168 hours.  ABG    Component Value Date/Time   PHART 7.450 09/14/2019 1646   PCO2ART 35.1 09/14/2019 1646   PO2ART 59.3 (L) 09/14/2019 1646   HCO3 24.1 09/14/2019 1646   TCO2 23 08/15/2019 1059   ACIDBASEDEF 2.0 08/27/2019 1059   O2SAT 90.5 09/14/2019 1646     Coagulation Profile: No results for input(s): INR, PROTIME in the last 168 hours.  Cardiac Enzymes: No results for input(s): CKTOTAL, CKMB, CKMBINDEX, TROPONINI in the last 168 hours.  HbA1C: No results found for: HGBA1C  CBG: Recent Labs  Lab 09/12/19 2019 09/13/19 0011  GLUCAP 170* 122*    Review of Systems:   All other systems reviewed and are negative.   Past Medical History  He,  has a past medical history of Pulmonary emboli (HCC) (2018) and Sleep apnea.   Surgical History    Past Surgical History:  Procedure Laterality Date   CARPAL TUNNEL RELEASE     LAMINECTOMY     NASAL SEPTUM SURGERY     NASAL SINUS SURGERY     NECK SURGERY       Social History   reports that he has quit smoking. He has never used smokeless tobacco.   Family History   His family history is not on file.   Allergies No Known Allergies   Home Medications  Prior to Admission medications   Medication Sig Start Date End Date Taking? Authorizing Provider    azithromycin (ZITHROMAX) 500 MG tablet Take 500 mg by mouth daily. 3 day course 09/09/19  Yes [provider]  benzonatate (TESSALON) 100 MG capsule Take 100 mg by mouth 3 (three) times daily as needed for cough.  09/06/19  Yes [provider]  dexamethasone (DECADRON) 2 MG tablet Take 6 mg by mouth daily. 5 day course 09/06/19  Yes [provider]  DM-Acetaminophen-Triprolidine (MUCINEX NIGHT COLD/FLU MAX STR) 10-325-1.25 MG TABS Take 2 tablets by mouth at bedtime as needed (cold and cough).   Yes [provider]  ELIQUIS 5 MG TABS tablet Take 5 mg by mouth 2 (two) times daily. 06/06/19  Yes [provider]  latanoprost (XALATAN) 0.005 % ophthalmic solution Place 1 drop into both eyes at bedtime. 01/29/16  Yes [provider]  pantoprazole (PROTONIX) 40 MG tablet Take 40 mg by mouth 2 (two) times daily. 04/12/19  Yes [provider]  Phenylephrine-DM-GG-APAP (MUCINEX FREEFROM COLD/FLU DAY PO) Take 2 tablets by mouth 2 (two) times daily as needed (cough and cold).   Yes [provider]  predniSONE (DELTASONE) 20 MG tablet Take by mouth as directed. 12 day course dosepack 09/09/19  Yes [provider]    Lenward Chancellor D, DO 09/16/19, 1:30 PM

## 2019-09-16 NOTE — Progress Notes (Signed)
   Subjective:   Rapid response called overnight for desaturations to the low 80s while standing. Once sitting again, temporary proning and temporary increased of HHFNC saturations improved.   This morning, he is proning when seen on rounds. He feels ok, cough not as severe as prior and feels like this has improved. He denies fever or chills. He is urinating well and has no dysuria. He has no cuts that he knows of. He would like to sit up and eat breakfast.   Objective:  Vital signs in last 24 hours: Vitals:   09/15/19 2029 09/16/19 0004 09/16/19 0346 09/16/19 0348  BP:  137/82  124/82  Pulse:  78 75 95  Resp: (!) 22 20  (!) 22  Temp:  98.2 F (36.8 C)  97.8 F (36.6 C)  TempSrc:  Axillary  Axillary  SpO2: (!) 81% 92%  (!) 87%  Weight:      Height:        Supplemental Oxygen: 45L NRB + HFNC at 90%  Constitution: prone in bed, acutely ill appearing  HENT: [place]/AT Eyes: no icterus or injection  Cardio: tachycardic, regular rhythm, no m/r/g  Respiratory: tachypnic, CTA bilaterally, no wheezing rales or rhonchi  Neuro: alert & oriented, normal affect  Assessment/Plan:  Principal Problem:   COVID-19 Active Problems:   Acute respiratory failure with hypoxia [facility])  54yo male with PMH of PE on eliquis with recurrent respiratory infections not requiring hospitalization, OSA, GERD, and obesity presenting with covid-19 pneumonia, first diagnosed 8/26, admitted with worsening shortness of breath and new oxygen requirement.   COVID-19 Hospital day 7. Episode of destaturation last night while moving around out of bed, was able to recover with proning and temporary increased to 60L HHFNC. Leukocytosis more elevated today, afebrile, no increased cough symptoms. Procalcitonin ordered, possible bacterial pneumonia in addition to covid, no symptoms of other infection. Possibly secondary to decadron, but he's been on this for 7 days and leukocytosis is higher than I would expcet. D-dimer  continues to be elevated, other inflammatory labs slightly improved.   - f/u procal, will consider addition of antibiotics - CTA ordered but unable to lay flat on back, will obtain when able - goal O2 >87%, respirations improved today while proning - strict I/O, daily weights  - cont scheduled tussionex, cont robitussen q4h prn, tylenol prn - cont. tessalon perles TID - completed remdesivir 9/4 - decadron day 7 - cont. Baricitinib, day 7 - cont. IS, flutter valve - appreciate respiratory therapy support  - trend inflammatory labs - PT once patient improves  History of PE - continue eliquis   VTE: eliquis IVF: none Diet: HH Code: full  Dispo: Anticipated discharge in approximately pending clinical improvement.   Mysti Haley A, DO 09/16/2019, 7:05 AM Pager: [phone] After 5pm on weekdays and 1pm on weekends: On Call Pager: [phone]

## 2019-09-16 NOTE — Significant Event (Signed)
Rapid Response Event Note   Reason for Call :  Called d/t decreased SpO2. Pt was on 30L HHFNC 100% and NRB prior to this.   Initial Focused Assessment:  Pt standing at side of bed using urinal. Pt is alert and oriented, in mild  respiratory distress(tachypneic, mildly labored). Pt is now on 60L HHFNC 100% and NRB with SpO2-low 80s. Pt saying he needs to have bowel movement.  Pt sat on BSC with SpO2 increasing to mid 80s. Pt able to have bowel movement and pt placed back in bed in prone position, SpO2 increased to 93%. Pt able to be titrated back down to 30L HHFNC 100% and NRB with SpO2 -89% and improvement in  tachypnea/WOB.   Interventions:  HHFNC titrated back down after activity.  Plan of Care:  Pt is going to desaturate with any activity. Ok to titrate [facility] during this time. Once pt back in bed, SpO2 should recover and HHFNC can be titrated back down. If SpO2 does not recover with in 10 minutes of getting pt back to bed, or if distress worsens, call RRT and RT for assistance. If SpO2 <87 and/or pt has increased WOB prior to activity-Do not get pt OOB.   Continue to encourage self-proning and educate to pt importance of not taking off oxygen and calling for assistance if he needs to get OOB.    Event Summary:   MD Notified:  Call [phone] Arrival Time:0230 End CBUL:[id number]  Terrilyn Saver, RN

## 2019-09-17 ENCOUNTER — Inpatient Hospital Stay (HOSPITAL_COMMUNITY): Payer: Federal, State, Local not specified - PPO

## 2019-09-17 DIAGNOSIS — J9601 Acute respiratory failure with hypoxia: Secondary | ICD-10-CM | POA: Diagnosis not present

## 2019-09-17 DIAGNOSIS — Z4682 Encounter for fitting and adjustment of non-vascular catheter: Secondary | ICD-10-CM | POA: Diagnosis not present

## 2019-09-17 DIAGNOSIS — J189 Pneumonia, unspecified organism: Secondary | ICD-10-CM | POA: Diagnosis not present

## 2019-09-17 DIAGNOSIS — J9690 Respiratory failure, unspecified, unspecified whether with hypoxia or hypercapnia: Secondary | ICD-10-CM | POA: Diagnosis not present

## 2019-09-17 LAB — CBC WITH DIFFERENTIAL/PLATELET
Abs Immature Granulocytes: 0.14 10*3/uL — ABNORMAL HIGH (ref 0.00–0.07)
Basophils Absolute: 0 10*3/uL (ref 0.0–0.1)
Basophils Relative: 0 %
Eosinophils Absolute: 0 10*3/uL (ref 0.0–0.5)
Eosinophils Relative: 0 %
HCT: 47.8 % (ref 39.0–52.0)
Hemoglobin: 16.1 g/dL (ref 13.0–17.0)
Immature Granulocytes: 1 %
Lymphocytes Relative: 4 %
Lymphs Abs: 0.9 10*3/uL (ref 0.7–4.0)
MCH: 31.6 pg (ref 26.0–34.0)
MCHC: 33.7 g/dL (ref 30.0–36.0)
MCV: 93.7 fL (ref 80.0–100.0)
Monocytes Absolute: 0.9 10*3/uL (ref 0.1–1.0)
Monocytes Relative: 4 %
Neutro Abs: 18.3 10*3/uL — ABNORMAL HIGH (ref 1.7–7.7)
Neutrophils Relative %: 91 %
Platelets: 122 10*3/uL — ABNORMAL LOW (ref 150–400)
RBC: 5.1 MIL/uL (ref 4.22–5.81)
RDW: 11.8 % (ref 11.5–15.5)
WBC: 20.3 10*3/uL — ABNORMAL HIGH (ref 4.0–10.5)
nRBC: 0 % (ref 0.0–0.2)

## 2019-09-17 LAB — HEMOGLOBIN A1C
Hgb A1c MFr Bld: 6 % — ABNORMAL HIGH (ref 4.8–5.6)
Mean Plasma Glucose: 125.5 mg/dL

## 2019-09-17 LAB — GLUCOSE, CAPILLARY
Glucose-Capillary: 152 mg/dL — ABNORMAL HIGH (ref 70–99)
Glucose-Capillary: 190 mg/dL — ABNORMAL HIGH (ref 70–99)
Glucose-Capillary: 172 mg/dL — ABNORMAL HIGH (ref 70–99)

## 2019-09-17 LAB — COMPREHENSIVE METABOLIC PANEL
ALT: 56 U/L — ABNORMAL HIGH (ref 0–44)
AST: 44 U/L — ABNORMAL HIGH (ref 15–41)
Albumin: 3.2 g/dL — ABNORMAL LOW (ref 3.5–5.0)
Alkaline Phosphatase: 97 U/L (ref 38–126)
Anion gap: 11 (ref 5–15)
BUN: 18 mg/dL (ref 6–20)
CO2: 28 mmol/L (ref 22–32)
Calcium: 8.9 mg/dL (ref 8.9–10.3)
Chloride: 97 mmol/L — ABNORMAL LOW (ref 98–111)
Creatinine, Ser: 1.01 mg/dL (ref 0.61–1.24)
GFR calc Af Amer: >60 mL/min (ref >60)
GFR calc non Af Amer: >60 mL/min (ref >60)
Glucose, Bld: 100 mg/dL — ABNORMAL HIGH (ref 70–99)
Potassium: 4.3 mmol/L (ref 3.5–5.1)
Sodium: 136 mmol/L (ref 135–145)
Total Bilirubin: 2.8 mg/dL — ABNORMAL HIGH (ref 0.3–1.2)
Total Protein: 6.9 g/dL (ref 6.5–8.1)

## 2019-09-17 LAB — POCT I-STAT 7, (LYTES, BLD GAS, ICA,H+H)
Acid-Base Excess: 5 mmol/L — ABNORMAL HIGH (ref 0.0–2.0)
Bicarbonate: 29.1 mmol/L — ABNORMAL HIGH (ref 20.0–28.0)
Calcium, Ion: 1.09 mmol/L — ABNORMAL LOW (ref 1.15–1.40)
HCT: 41 % (ref 39.0–52.0)
Hemoglobin: 13.9 g/dL (ref 13.0–17.0)
O2 Saturation: 99 %
Patient temperature: 98.3
Potassium: 5.3 mmol/L — ABNORMAL HIGH (ref 3.5–5.1)
Sodium: 130 mmol/L — ABNORMAL LOW (ref 135–145)
TCO2: 30 mmol/L (ref 22–32)
pCO2 arterial: 41.3 mmHg (ref 32.0–48.0)
pH, Arterial: 7.455 — ABNORMAL HIGH (ref 7.350–7.450)
pO2, Arterial: 143 mmHg — ABNORMAL HIGH (ref 83.0–108.0)

## 2019-09-17 LAB — D-DIMER, QUANTITATIVE: D-Dimer, Quant: >20 ug/mL-FEU — ABNORMAL HIGH (ref 0.00–0.50)

## 2019-09-17 LAB — MAGNESIUM: Magnesium: 2.3 mg/dL (ref 1.7–2.4)

## 2019-09-17 LAB — PHOSPHORUS: Phosphorus: 6.5 mg/dL — ABNORMAL HIGH (ref 2.5–4.6)

## 2019-09-17 LAB — C-REACTIVE PROTEIN: CRP: 6.7 mg/dL — ABNORMAL HIGH (ref <1.0)

## 2019-09-17 LAB — FERRITIN: Ferritin: 1306 ng/mL — ABNORMAL HIGH (ref 24–336)

## 2019-09-17 MED ORDER — POLYETHYLENE GLYCOL 3350 17 G PO PACK: 17.0000 g | PACK | Freq: Every day | ORAL | Status: DC

## 2019-09-17 MED ORDER — VITAL HIGH PROTEIN PO LIQD
1000.0000 mL | ORAL | Status: DC
  Administered 2019-09-17: 1000 mL
  Filled 2019-09-17 (×2): qty 1000

## 2019-09-17 MED ORDER — SODIUM CHLORIDE 0.9 % IV SOLN
0.5000 mg/h | INTRAVENOUS | Status: DC
  Administered 2019-09-17: 0.5 mg/h via INTRAVENOUS
  Administered 2019-09-18: 2 mg/h via INTRAVENOUS
  Administered 2019-09-19 – 2019-09-20 (×3): 3 mg/h via INTRAVENOUS
  Administered 2019-09-21 – 2019-09-28 (×12): 4 mg/h via INTRAVENOUS
  Administered 2019-09-28: 3 mg/h via INTRAVENOUS
  Administered 2019-09-29: 4 mg/h via INTRAVENOUS
  Filled 2019-09-17 (×30): qty 5

## 2019-09-17 MED ORDER — DOCUSATE SODIUM 50 MG/5ML PO LIQD
100.0000 mg | Freq: Two times a day (BID) | ORAL | Status: DC
  Administered 2019-09-17: 100 mg via ORAL
  Filled 2019-09-17: qty 10

## 2019-09-17 MED ORDER — SUCCINYLCHOLINE CHLORIDE 20 MG/ML IJ SOLN
100.0000 mg | Freq: Once | INTRAMUSCULAR | Status: AC
  Administered 2019-09-17: 100 mg via INTRAVENOUS
  Filled 2019-09-17: qty 5

## 2019-09-17 MED ORDER — HYDROMORPHONE BOLUS VIA INFUSION
0.5000 mg | INTRAVENOUS | Status: DC | PRN
  Administered 2019-09-22 – 2019-09-27 (×8): 0.5 mg via INTRAVENOUS
  Filled 2019-09-17 (×2): qty 1

## 2019-09-17 MED ORDER — HYDROMORPHONE HCL 1 MG/ML IJ SOLN
1.0000 mg | INTRAMUSCULAR | Status: AC | PRN
  Administered 2019-09-17 – 2019-09-19 (×3): 1 mg via INTRAVENOUS

## 2019-09-17 MED ORDER — SODIUM CHLORIDE 0.9 % IV SOLN
INTRAVENOUS | Status: DC | PRN
  Administered 2019-09-25: 10 mL via INTRA_ARTERIAL

## 2019-09-17 MED ORDER — ETOMIDATE 2 MG/ML IV SOLN
INTRAVENOUS | Status: AC
  Filled 2019-09-17: qty 20

## 2019-09-17 MED ORDER — SUCCINYLCHOLINE CHLORIDE 200 MG/10ML IV SOSY
PREFILLED_SYRINGE | INTRAVENOUS | Status: AC
  Filled 2019-09-17: qty 10

## 2019-09-17 MED ORDER — FENTANYL CITRATE (PF) 100 MCG/2ML IJ SOLN
INTRAMUSCULAR | Status: AC
  Filled 2019-09-17: qty 2

## 2019-09-17 MED ORDER — MIDAZOLAM HCL 2 MG/2ML IJ SOLN
2.0000 mg | Freq: Once | INTRAMUSCULAR | Status: AC
  Administered 2019-09-17: 2 mg via INTRAVENOUS

## 2019-09-17 MED ORDER — INSULIN ASPART 100 UNIT/ML ~~LOC~~ SOLN
0.0000 [IU] | Freq: Three times a day (TID) | SUBCUTANEOUS | Status: DC
  Administered 2019-09-17 (×2): 3 [IU] via SUBCUTANEOUS
  Administered 2019-09-18: 2 [IU] via SUBCUTANEOUS
  Administered 2019-09-18 (×2): 3 [IU] via SUBCUTANEOUS

## 2019-09-17 MED ORDER — FENTANYL CITRATE (PF) 100 MCG/2ML IJ SOLN
100.0000 ug | Freq: Once | INTRAMUSCULAR | Status: AC
  Administered 2019-09-17: 100 ug via INTRAVENOUS

## 2019-09-17 MED ORDER — ORAL CARE MOUTH RINSE
15.0000 mL | OROMUCOSAL | Status: DC
  Administered 2019-09-17 – 2019-09-29 (×119): 15 mL via OROMUCOSAL

## 2019-09-17 MED ORDER — HYDROMORPHONE HCL 1 MG/ML IJ SOLN: 1.0000 mg | Freq: Once | INTRAMUSCULAR | Status: AC

## 2019-09-17 MED ORDER — ETOMIDATE 2 MG/ML IV SOLN
20.0000 mg | Freq: Once | INTRAVENOUS | Status: AC
  Administered 2019-09-17: 20 mg via INTRAVENOUS

## 2019-09-17 MED ORDER — DEXMEDETOMIDINE HCL IN NACL 400 MCG/100ML IV SOLN
0.0000 ug/kg/h | INTRAVENOUS | Status: AC
  Administered 2019-09-17: 1 ug/kg/h via INTRAVENOUS
  Administered 2019-09-17: 0.7 ug/kg/h via INTRAVENOUS
  Administered 2019-09-18: 0.8 ug/kg/h via INTRAVENOUS
  Administered 2019-09-18: 1.1 ug/kg/h via INTRAVENOUS
  Administered 2019-09-18: 1 ug/kg/h via INTRAVENOUS
  Administered 2019-09-18 (×2): 0.8 ug/kg/h via INTRAVENOUS
  Administered 2019-09-19: 1.2 ug/kg/h via INTRAVENOUS
  Administered 2019-09-19 – 2019-09-20 (×8): 1 ug/kg/h via INTRAVENOUS
  Filled 2019-09-17 (×18): qty 100

## 2019-09-17 MED ORDER — PHENYLEPHRINE HCL-NACL 10-0.9 MG/250ML-% IV SOLN
INTRAVENOUS | Status: AC
  Filled 2019-09-17: qty 250

## 2019-09-17 MED ORDER — ROCURONIUM BROMIDE 10 MG/ML (PF) SYRINGE
PREFILLED_SYRINGE | INTRAVENOUS | Status: AC
  Filled 2019-09-17: qty 10

## 2019-09-17 MED ORDER — MIDAZOLAM HCL 2 MG/2ML IJ SOLN
2.0000 mg | INTRAMUSCULAR | Status: DC | PRN
  Administered 2019-09-17 – 2019-09-25 (×24): 2 mg via INTRAVENOUS
  Filled 2019-09-17 (×24): qty 2

## 2019-09-17 MED ORDER — HYDROMORPHONE HCL 1 MG/ML IJ SOLN
1.0000 mg | INTRAMUSCULAR | Status: DC | PRN
  Administered 2019-09-17: 2 mg via INTRAVENOUS
  Administered 2019-09-24: 1 mg via INTRAVENOUS
  Administered 2019-09-25 (×2): 2 mg via INTRAVENOUS
  Filled 2019-09-17: qty 3
  Filled 2019-09-17: qty 1

## 2019-09-17 MED ORDER — CHLORHEXIDINE GLUCONATE 0.12% ORAL RINSE (MEDLINE KIT)
15.0000 mL | Freq: Two times a day (BID) | OROMUCOSAL | Status: DC
  Administered 2019-09-17 – 2019-09-29 (×24): 15 mL via OROMUCOSAL

## 2019-09-17 MED ORDER — MIDAZOLAM HCL 2 MG/2ML IJ SOLN
INTRAMUSCULAR | Status: AC
  Administered 2019-09-17: 2 mg
  Filled 2019-09-17: qty 4

## 2019-09-17 MED ORDER — PROSOURCE TF PO LIQD
45.0000 mL | Freq: Two times a day (BID) | ORAL | Status: DC
  Administered 2019-09-17 – 2019-09-18 (×2): 45 mL
  Filled 2019-09-17 (×2): qty 45

## 2019-09-17 NOTE — Progress Notes (Signed)
RT NOTES: A-line attempted multiple times by 2 RRTs, obtained flash but unable to thread catheter.

## 2019-09-17 NOTE — Progress Notes (Addendum)
NAME:  Samuel Peterson, MRN:  [id number], DOB:  1965/04/14, LOS: 7 ADMISSION DATE:  Sep 12, 2019, CONSULTATION DATE:  09/14/19 REFERRING MD:  Criselda Peaches, CHIEF COMPLAINT:  COVID-19   Brief History   54yo male with PMH of PE on eliquis and recurrent respiratory infections not requiring hospitalization presenting with covid-19 pneumonia, first diagnosed 8/26, admitted with worsening shortness of breath and new oxygen requirement.   History of present illness   PCCM consulted on 9/4 for worsening hypoxia with ambulation. Was maintaining O2 sats and breathing comfortably on HHFNC. No indication for intubation at that time. Re-consulted 09/16/19 for increased work of breathing and oxygen requirement.   Past Medical History  PE OSA on CPAP  Significant Hospital Events    8/31>admitted 9/6>ICU transfer   Consults:  none  Procedures:  none  Significant Diagnostic Tests:  8/31 CXR> diffuse bilateral interstitial opacities   Micro Data:  8/31 Blood cultures x2 >>NGTD   Antimicrobials:    Interim history/subjective:  Reports being better today than yesterday.  No acute distress at rest although he is on 100% FiO2.  Not currently on antimicrobials.  No recent chest x-ray.  Objective   Blood pressure 133/80, pulse (!) 102, temperature (!) 97.3 F (36.3 C), temperature source Axillary, resp. rate (!) 21, height 5' 9.5" (1.765 m), weight 110.2 kg, SpO2 90 %.    FiO2 (%):  [100 %] 100 %   Intake/Output Summary (Last 24 hours) at 09/17/2019 0759 Last data filed at 09/16/2019 1800 Gross per 24 hour  Intake --  Output 550 ml  Net -550 ml   Filed Weights   09/12/2019 0933 09/11/19 1140 09/13/19 0605  Weight: 111.1 kg 113.4 kg 110.2 kg    Examination: General: Obese male no acute distress at rest.  Able to carry on conversation without difficulty HEENT: Short neck no JVD is appreciated Neuro: Grossly intact without focal defect CV: Sinus rhythm sinus tach systolic blood pressure 140 PULM:  Decreased breath sounds throughout faint crackles in bases.  Currently on 100% FiO2 to high flow nasal cannula with sats running 87-97 GI: soft, bsx4 active nontender Extremities: warm/dry, negative edema  Skin: no rashes or lesions   Resolved Hospital Problem list     Assessment & Plan:  Acute hypoxic respiratory failure due to COVID-19 Pneumonia Continue high flow nasal cannula Pulmonary toilet as tolerated No need intubated at this time Serial chest x-ray Intermittent ABGs if O2 saturations Steroids as ordered No apparent need for antimicrobial therapy  Steroid-induced hyperglycemia CBG (last 3)  No results for input(s): GLUCAP in the last 72 hours. Monitor utilize sliding scale insulin protocol as needed  History of PE Continue anticoagulation  Best practice:  Diet: HH DVT prophylaxis: Eliquis  Code Status: Full Family Communication: 09/17/2019 wife updated Disposition: ICU  Labs   CBC: Recent Labs  Lab 09/11/19 0459 09/11/19 0459 09/12/19 1355 09/13/19 1622 09/14/19 0130 09/15/19 0702 09/16/19 0118  WBC 7.5   < > 8.0 7.5 8.8 13.7* 17.3*  NEUTROABS 5.7  --  6.0 6.6 6.5 11.2*  --   HGB 16.3   < > 16.7 15.6 15.4 15.7 15.5  HCT 48.8   < > 49.4 46.6 46.2 46.4 44.9  MCV 98.4   < > 95.4 94.3 94.3 93.7 94.1  PLT 191   < > 235 175 158 139* 125*   < > = values in this interval not displayed.    Basic Metabolic Panel: Recent Labs  Lab 09/12/19 1355 09/13/19 1622  09/14/19 0130 09/15/19 0702 09/16/19 0118  NA 139 135 137 140 137  K 3.7 4.3 4.0 4.1 4.4  CL 102 102 99 100 100  CO2 29 23 27 30 27   GLUCOSE 122* 219* 141* 117* 155*  BUN 25* 22* 21* 18 23*  CREATININE 1.11 1.17 1.01 0.97 1.15  CALCIUM 8.7* 8.8* 8.8* 8.9 8.7*  MG 2.0 2.0  --   --  2.2  PHOS 3.4  --   --   --   --    GFR: Estimated Creatinine Clearance: 91.6 mL/min (by C-G formula based on SCr of 1.15 mg/dL). Recent Labs  Lab 2019/09/19 1036 09-19-19 1500 September 19, 2019 1522 09/11/19 0459  09/13/19 1622 09/14/19 0130 09/15/19 0702 09/16/19 0118 09/16/19 0759  PROCALCITON  --   --  0.22  --   --   --   --   --  <0.10  WBC  --   --   --    < > 7.5 8.8 13.7* 17.3*  --   LATICACIDVEN 1.9 1.4  --   --   --   --   --   --   --    < > = values in this interval not displayed.    Liver Function Tests: Recent Labs  Lab 09/12/19 1355 09/13/19 1622 09/14/19 0130 09/15/19 0702 09/16/19 0118  AST 70* 56* 45* 40 36  ALT 71* 96* 83* 79* 64*  ALKPHOS 64 73 73 83 80  BILITOT 1.2 1.5* 1.9* 1.8* 1.5*  PROT 7.4 6.9 6.6 6.8 6.3*  ALBUMIN 3.7 3.3* 3.3* 3.3* 3.1*   No results for input(s): LIPASE, AMYLASE in the last 168 hours. No results for input(s): AMMONIA in the last 168 hours.  ABG    Component Value Date/Time   PHART 7.450 09/14/2019 1646   PCO2ART 35.1 09/14/2019 1646   PO2ART 59.3 (L) 09/14/2019 1646   HCO3 24.1 09/14/2019 1646   TCO2 23 Sep 19, 2019 1059   ACIDBASEDEF 2.0 2019-09-19 1059   O2SAT 90.5 09/14/2019 1646     Coagulation Profile: No results for input(s): INR, PROTIME in the last 168 hours.  Cardiac Enzymes: No results for input(s): CKTOTAL, CKMB, CKMBINDEX, TROPONINI in the last 168 hours.  HbA1C: No results found for: HGBA1C  CBG: Recent Labs  Lab 09/12/19 2019 09/13/19 0011  GLUCAP 170* 122*    App cct 30 min  11/13/19 Danell Verno ACNP Acute Care Nurse Practitioner [facility] Pulmonary/Critical Care Please consult Amion 09/17/2019, 8:00 AM

## 2019-09-17 NOTE — Procedures (Signed)
Intubation Procedure Note  TOIVO BORDON  [id number]  May 23, 1965  Date:09/17/19  Time:6:16 PM   Provider Performing:Valori Hollenkamp    Procedure: Intubation (31500)  Indication(s) Respiratory Failure  Consent Unable to obtain consent due to emergent nature of procedure.   Anesthesia Etomidate, Versed, Fentanyl and Succinylcholine   Time Out Verified patient identification, verified procedure, site/side was marked, verified correct patient position, special equipment/implants available, medications/allergies/relevant history reviewed, required imaging and test results available.   Sterile Technique Usual hand hygeine, masks, and gloves were used   Procedure Description Patient positioned in bed supine.  Sedation given as noted above.  Patient was intubated with endotracheal tube using Glidescope.  View was Grade 1 full glottis .  Number of attempts was 1.  Colorimetric CO2 detector was consistent with tracheal placement.   Complications/Tolerance None; patient tolerated the procedure well. Chest X-ray is ordered to verify placement.  Lynnell Catalan, MD [facility] ICU Physician [facility] [place]  Pager: [phone] Mobile: [phone] After hours: [phone].  09/17/2019, 6:17 PM

## 2019-09-18 ENCOUNTER — Inpatient Hospital Stay (HOSPITAL_COMMUNITY): Payer: Federal, State, Local not specified - PPO

## 2019-09-18 DIAGNOSIS — U071 COVID-19: Principal | ICD-10-CM

## 2019-09-18 DIAGNOSIS — J9690 Respiratory failure, unspecified, unspecified whether with hypoxia or hypercapnia: Secondary | ICD-10-CM | POA: Diagnosis not present

## 2019-09-18 LAB — COMPREHENSIVE METABOLIC PANEL
ALT: 48 U/L — ABNORMAL HIGH (ref 0–44)
AST: 25 U/L (ref 15–41)
Albumin: 2.8 g/dL — ABNORMAL LOW (ref 3.5–5.0)
Alkaline Phosphatase: 87 U/L (ref 38–126)
Anion gap: 12 (ref 5–15)
BUN: 32 mg/dL — ABNORMAL HIGH (ref 6–20)
CO2: 26 mmol/L (ref 22–32)
Calcium: 8.5 mg/dL — ABNORMAL LOW (ref 8.9–10.3)
Chloride: 96 mmol/L — ABNORMAL LOW (ref 98–111)
Creatinine, Ser: 1.2 mg/dL (ref 0.61–1.24)
GFR calc Af Amer: >60 mL/min (ref >60)
GFR calc non Af Amer: >60 mL/min (ref >60)
Glucose, Bld: 142 mg/dL — ABNORMAL HIGH (ref 70–99)
Potassium: 5 mmol/L (ref 3.5–5.1)
Sodium: 134 mmol/L — ABNORMAL LOW (ref 135–145)
Total Bilirubin: 1.9 mg/dL — ABNORMAL HIGH (ref 0.3–1.2)
Total Protein: 6.9 g/dL (ref 6.5–8.1)

## 2019-09-18 LAB — CBC WITH DIFFERENTIAL/PLATELET
Abs Immature Granulocytes: 0.11 10*3/uL — ABNORMAL HIGH (ref 0.00–0.07)
Basophils Absolute: 0 10*3/uL (ref 0.0–0.1)
Basophils Relative: 0 %
Eosinophils Absolute: 0 10*3/uL (ref 0.0–0.5)
Eosinophils Relative: 0 %
HCT: 44.4 % (ref 39.0–52.0)
Hemoglobin: 15 g/dL (ref 13.0–17.0)
Immature Granulocytes: 1 %
Lymphocytes Relative: 6 %
Lymphs Abs: 1.2 10*3/uL (ref 0.7–4.0)
MCH: 32.8 pg (ref 26.0–34.0)
MCHC: 33.8 g/dL (ref 30.0–36.0)
MCV: 96.9 fL (ref 80.0–100.0)
Monocytes Absolute: 0.7 10*3/uL (ref 0.1–1.0)
Monocytes Relative: 4 %
Neutro Abs: 16 10*3/uL — ABNORMAL HIGH (ref 1.7–7.7)
Neutrophils Relative %: 89 %
Platelets: 105 10*3/uL — ABNORMAL LOW (ref 150–400)
RBC: 4.58 MIL/uL (ref 4.22–5.81)
RDW: 11.9 % (ref 11.5–15.5)
WBC: 18 10*3/uL — ABNORMAL HIGH (ref 4.0–10.5)
nRBC: 0 % (ref 0.0–0.2)

## 2019-09-18 LAB — GLUCOSE, CAPILLARY
Glucose-Capillary: 142 mg/dL — ABNORMAL HIGH (ref 70–99)
Glucose-Capillary: 158 mg/dL — ABNORMAL HIGH (ref 70–99)
Glucose-Capillary: 161 mg/dL — ABNORMAL HIGH (ref 70–99)
Glucose-Capillary: 163 mg/dL — ABNORMAL HIGH (ref 70–99)
Glucose-Capillary: 173 mg/dL — ABNORMAL HIGH (ref 70–99)
Glucose-Capillary: 193 mg/dL — ABNORMAL HIGH (ref 70–99)
Glucose-Capillary: 214 mg/dL — ABNORMAL HIGH (ref 70–99)

## 2019-09-18 LAB — PHOSPHORUS: Phosphorus: 6.5 mg/dL — ABNORMAL HIGH (ref 2.5–4.6)

## 2019-09-18 LAB — MAGNESIUM: Magnesium: 2.8 mg/dL — ABNORMAL HIGH (ref 1.7–2.4)

## 2019-09-18 MED ORDER — PROSOURCE TF PO LIQD
45.0000 mL | Freq: Three times a day (TID) | ORAL | Status: DC
  Administered 2019-09-18 – 2019-09-29 (×34): 45 mL
  Filled 2019-09-18 (×34): qty 45

## 2019-09-18 MED ORDER — POLYETHYLENE GLYCOL 3350 17 G PO PACK: 17.0000 g | PACK | Freq: Every day | ORAL | Status: DC | PRN

## 2019-09-18 MED ORDER — ALBUTEROL SULFATE (2.5 MG/3ML) 0.083% IN NEBU
2.5000 mg | INHALATION_SOLUTION | Freq: Four times a day (QID) | RESPIRATORY_TRACT | Status: DC
  Administered 2019-09-18 – 2019-09-19 (×4): 2.5 mg via RESPIRATORY_TRACT
  Filled 2019-09-18 (×4): qty 3

## 2019-09-18 MED ORDER — ATROPINE SULFATE 1 MG/10ML IJ SOSY
PREFILLED_SYRINGE | INTRAMUSCULAR | Status: AC
  Filled 2019-09-18: qty 10

## 2019-09-18 MED ORDER — HYDROCOD POLST-CPM POLST ER 10-8 MG/5ML PO SUER: 5.0000 mL | Freq: Two times a day (BID) | ORAL | Status: DC

## 2019-09-18 MED ORDER — BARICITINIB 2 MG PO TABS
4.0000 mg | ORAL_TABLET | Freq: Every day | ORAL | Status: DC
  Administered 2019-09-18 – 2019-09-22 (×5): 4 mg
  Filled 2019-09-18 (×5): qty 2

## 2019-09-18 MED ORDER — POLYETHYLENE GLYCOL 3350 17 G PO PACK
17.0000 g | PACK | Freq: Every day | ORAL | Status: DC
  Administered 2019-09-18 – 2019-09-25 (×7): 17 g
  Filled 2019-09-18 (×7): qty 1

## 2019-09-18 MED ORDER — VITAL 1.5 CAL PO LIQD
1000.0000 mL | ORAL | Status: DC
  Administered 2019-09-19 – 2019-09-24 (×6): 1000 mL
  Filled 2019-09-18 (×4): qty 1000

## 2019-09-18 MED ORDER — PANTOPRAZOLE SODIUM 40 MG PO PACK
40.0000 mg | PACK | Freq: Two times a day (BID) | ORAL | Status: DC
  Administered 2019-09-18 – 2019-09-29 (×23): 40 mg
  Filled 2019-09-18 (×24): qty 20

## 2019-09-18 MED ORDER — APIXABAN 5 MG PO TABS
5.0000 mg | ORAL_TABLET | Freq: Two times a day (BID) | ORAL | Status: DC
  Administered 2019-09-18 – 2019-09-21 (×7): 5 mg
  Filled 2019-09-18 (×7): qty 1

## 2019-09-18 MED ORDER — DOCUSATE SODIUM 50 MG/5ML PO LIQD
100.0000 mg | Freq: Two times a day (BID) | ORAL | Status: DC
  Administered 2019-09-18 – 2019-09-29 (×20): 100 mg
  Filled 2019-09-18 (×21): qty 10

## 2019-09-18 MED ORDER — ACETAMINOPHEN 325 MG PO TABS
650.0000 mg | ORAL_TABLET | Freq: Four times a day (QID) | ORAL | Status: DC | PRN
  Administered 2019-09-21 – 2019-09-28 (×13): 650 mg
  Filled 2019-09-18 (×13): qty 2

## 2019-09-18 MED ORDER — DEXAMETHASONE 6 MG PO TABS
6.0000 mg | ORAL_TABLET | ORAL | Status: AC
  Administered 2019-09-18 – 2019-09-19 (×2): 6 mg
  Filled 2019-09-18 (×2): qty 1

## 2019-09-18 MED ORDER — MELATONIN 3 MG PO TABS
3.0000 mg | ORAL_TABLET | Freq: Every day | ORAL | Status: DC
  Administered 2019-09-19 – 2019-09-27 (×9): 3 mg
  Filled 2019-09-18 (×9): qty 1

## 2019-09-18 MED ORDER — GUAIFENESIN-DM 100-10 MG/5ML PO SYRP
10.0000 mL | ORAL_SOLUTION | ORAL | Status: DC | PRN
  Filled 2019-09-18: qty 10

## 2019-09-18 NOTE — Progress Notes (Addendum)
NAME:  Samuel Peterson, MRN:  [id number], DOB:  [birth year], LOS: 8 ADMISSION DATE:  08/28/2019, CONSULTATION DATE:  09/14/19 REFERRING MD:  Criselda Peaches, CHIEF COMPLAINT:  COVID-19   Brief History   54yo male with PMH of PE on eliquis and recurrent respiratory infections not requiring hospitalization presenting with covid-19 pneumonia, first diagnosed 8/26, admitted with worsening shortness of breath and new oxygen requirement.   History of present illness   PCCM consulted on 9/4 for worsening hypoxia with ambulation. Was maintaining O2 sats and breathing comfortably on HHFNC. No indication for intubation at that time. Re-consulted 09/16/19 for increased work of breathing and oxygen requirement.   Past Medical History  PE OSA on CPAP  Significant Hospital Events    8/31>admitted 9/6>ICU transfer   Consults:  none  Procedures:  09/17/2019 intubation>> 09/18/2019 plan for arterial line  Significant Diagnostic Tests:  8/31 CXR> diffuse bilateral interstitial opacities  09/18/2019 chest x-ray with bilateral airspace disease crepitation Micro Data:  8/31 Blood cultures x2 >>NGTD   Antimicrobials:    Interim history/subjective:   Increasing respiratory distress on 01/17/2019 intubated and sedation started.  Objective   Blood pressure 116/72, pulse 84, temperature 98.1 F (36.7 C), temperature source Axillary, resp. rate (!) 21, height 5' 9.5" (1.765 m), weight 103.9 kg, SpO2 92 %.    Vent Mode: PRVC FiO2 (%):  [60 %-100 %] 60 % Set Rate:  [24 bmp] 24 bmp Vt Set:  [430 mL] 430 mL PEEP:  [15 cmH20] 15 cmH20 Plateau Pressure:  [21 cmH20-26 cmH20] 26 cmH20   Intake/Output Summary (Last 24 hours) at 09/18/2019 0828 Last data filed at 09/18/2019 0800 Gross per 24 hour  Intake 857.53 ml  Output 1700 ml  Net -842.47 ml   Filed Weights   09/11/19 1140 09/13/19 0605 09/18/19 0500  Weight: 113.4 kg 110.2 kg 103.9 kg    Examination: General: Middle-aged male sedated on full mechanical  ventilatory support HEENT: Endotracheal tube is in place feeding tube is in place Neuro: Sedated CV: Heart sounds are regular regular rate rhythm.  Some bradycardia noted with vagal stimulation PULM: Diminished throughout Vent pressure regulated volume control FIO2 60% PEEP 16 RATE 24 VT 430  GI: soft, bsx4 active  GU: Amber urine Extremities: warm/dry,  edema  Skin: no rashes or lesions    Resolved Hospital Problem list     Assessment & Plan:  Acute hypoxic respiratory failure due to COVID-19 Pneumonia Required intubation 09/17/2019 Currently on 6% 10 of PEEP with sats of 92% No need for proning at this time Sedation as needed with Dilaudid Versed Precedex Serial chest x-ray Place arterial line  Steroid-induced hyperglycemia CBG (last 3)  Recent Labs    09/18/19 0012 09/18/19 0344 09/18/19 0754  GLUCAP 161* 158* 142*   Sliding-scale insulin protocol  History of PE Continue anticoagulation  Best practice:  Diet: HH DVT prophylaxis: Eliquis  Code Status: Full Family Communication: 9/8 called wife left message. Disposition: ICU  Labs   CBC: Recent Labs  Lab 09/12/19 1355 09/12/19 1355 09/13/19 1622 09/13/19 1622 09/14/19 0130 09/15/19 0702 09/16/19 0118 09/17/19 0500 09/17/19 2030  WBC 8.0   < > 7.5  --  8.8 13.7* 17.3* 20.3*  --   NEUTROABS 6.0  --  6.6  --  6.5 11.2*  --  18.3*  --   HGB 16.7   < > 15.6   < > 15.4 15.7 15.5 16.1 13.9  HCT 49.4   < > 46.6   < >  46.2 46.4 44.9 47.8 41.0  MCV 95.4   < > 94.3  --  94.3 93.7 94.1 93.7  --   PLT 235   < > 175  --  158 139* 125* 122*  --    < > = values in this interval not displayed.    Basic Metabolic Panel: Recent Labs  Lab 09/12/19 1355 09/12/19 1355 09/13/19 1622 09/13/19 1622 09/14/19 0130 09/15/19 0702 09/16/19 0118 09/17/19 0500 09/17/19 1952 09/17/19 2030  NA 139   < > 135   < > 137 140 137 136  --  130*  K 3.7   < > 4.3   < > 4.0 4.1 4.4 4.3  --  5.3*  CL 102   < > 102  --  99  100 100 97*  --   --   CO2 29   < > 23  --  27 30 27 28   --   --   GLUCOSE 122*   < > 219*  --  141* 117* 155* 100*  --   --   BUN 25*   < > 22*  --  21* 18 23* 18  --   --   CREATININE 1.11   < > 1.17  --  1.01 0.97 1.15 1.01  --   --   CALCIUM 8.7*   < > 8.8*  --  8.8* 8.9 8.7* 8.9  --   --   MG 2.0  --  2.0  --   --   --  2.2  --  2.3  --   PHOS 3.4  --   --   --   --   --   --   --  6.5*  --    < > = values in this interval not displayed.   GFR: Estimated Creatinine Clearance: 101.3 mL/min (by C-G formula based on SCr of 1.01 mg/dL). Recent Labs  Lab 09/14/19 0130 09/15/19 0702 09/16/19 0118 09/16/19 0759 09/17/19 0500  PROCALCITON  --   --   --  <0.10  --   WBC 8.8 13.7* 17.3*  --  20.3*    Liver Function Tests: Recent Labs  Lab 09/13/19 1622 09/14/19 0130 09/15/19 0702 09/16/19 0118 09/17/19 0500  AST 56* 45* 40 36 44*  ALT 96* 83* 79* 64* 56*  ALKPHOS 73 73 83 80 97  BILITOT 1.5* 1.9* 1.8* 1.5* 2.8*  PROT 6.9 6.6 6.8 6.3* 6.9  ALBUMIN 3.3* 3.3* 3.3* 3.1* 3.2*   No results for input(s): LIPASE, AMYLASE in the last 168 hours. No results for input(s): AMMONIA in the last 168 hours.  ABG    Component Value Date/Time   PHART 7.455 (H) 09/17/2019 2030   PCO2ART 41.3 09/17/2019 2030   PO2ART 143 (H) 09/17/2019 2030   HCO3 29.1 (H) 09/17/2019 2030   TCO2 30 09/17/2019 2030   ACIDBASEDEF 2.0 08/27/2019 1059   O2SAT 99.0 09/17/2019 2030     Coagulation Profile: No results for input(s): INR, PROTIME in the last 168 hours.  Cardiac Enzymes: No results for input(s): CKTOTAL, CKMB, CKMBINDEX, TROPONINI in the last 168 hours.  HbA1C: Hgb A1c MFr Bld  Date/Time Value Ref Range Status  09/17/2019 08:18 AM 6.0 (H) 4.8 - 5.6 % Final    Comment:    (NOTE) Pre diabetes:          5.7%-6.4%  Diabetes:              >6.4%  Glycemic control  for   <7.0% adults with diabetes     CBG: Recent Labs  Lab 09/17/19 1530 09/17/19 1943 09/18/19 0012 09/18/19 0344  09/18/19 0754  GLUCAP 190* 172* 161* 158* 142*    App cct 30 min  Brett Canales Areyana Leoni ACNP Acute Care Nurse Practitioner [facility] Please consult Amion 09/18/2019, 8:28 AM

## 2019-09-18 NOTE — Progress Notes (Signed)
eLink Physician-Brief Progress Note Patient Name: Samuel Peterson DOB: November 17, 1965 MRN: [id number]   Date of Service  09/18/2019  HPI/Events of Note  Urinary retention requiring in and out cath  eICU Interventions  Foley catheter ordered     Intervention Category Intermediate Interventions: Oliguria - evaluation and management  Darl Pikes 09/18/2019, 6:25 AM

## 2019-09-18 NOTE — Progress Notes (Signed)
Initial Nutrition Assessment  DOCUMENTATION CODES:   Not applicable  INTERVENTION:   Tube Feeding:  Vital 1.5 at 60 ml/hr Pro-Source 45 mL TID Provides 2280 kcals, 130 g of protein and 1094 mL of free water Meets 100% estimated calorie and protein needs   NUTRITION DIAGNOSIS:   Inadequate oral intake related to acute illness as evidenced by NPO status.  GOAL:   Patient will meet greater than or equal to 90% of their needs  MONITOR:   Vent status, TF tolerance, Weight trends, Labs  REASON FOR ASSESSMENT:   Consult, Ventilator Enteral/tube feeding initiation and management  ASSESSMENT:   54 yo male admitted with COVID-19 pneumonia on 8/26, ultimately requiring transfer to ICU with intubation on 9/7. PMH includes PE, sleep apnea, nasal sinus and septum surgery   8/26 COVID-19 dx 8/31 Admitted 9/07 Intubated  Pt remains on vent support, no requiring vaspressors  OG tube in stomach per xray, tolerating Vital High Protein at 40 ml/hr  Current weight 103.9kg; admit weight 111.1 kg.   Recorded po intake 0-100%  Labs: CBGs 142-172 (ICU goal 140-180), sodium 130 (L), potassium 5.3 (H), phosphorus 6.5 (H) Meds: ss novolog   Diet Order:   Diet Order    None      EDUCATION NEEDS:   Not appropriate for education at this time  Skin:  Skin Assessment: Reviewed RN Assessment  Last BM:  9/7  Height:   Ht Readings from Last 1 Encounters:  09/11/19 5' 9.5" (1.765 m)    Weight:   Wt Readings from Last 1 Encounters:  09/18/19 103.9 kg    BMI:  Body mass index is 33.34 kg/m.  Estimated Nutritional Needs:   Kcal:  6301-6010 kcals  Protein:  125-150 g  Fluid:  >/= 2 L   Romelle Starcher MS, RDN, LDN, CNSC Registered Dietitian III Clinical Nutrition RD Pager and On-Call Pager Number Located in IXL

## 2019-09-18 NOTE — Procedures (Signed)
Arterial Catheter Insertion Procedure Note Samuel Peterson [id number] [birth year]  Procedure: Insertion of Arterial Catheter  Indications: Blood pressure monitoring and Frequent blood sampling  Procedure Details Consent: Unable to obtain consent because of emergent medical necessity. Time Out: Verified patient identification, verified procedure, site/side was marked, verified correct patient position, special equipment/implants available, medications/allergies/relevent history reviewed, required imaging and test results available.  Performed  Maximum sterile technique was used including antiseptics, cap, gloves, gown, hand hygiene, mask and sheet. Skin prep: Chlorhexidine; local anesthetic administered 20 gauge catheter was inserted into left brachial artery using the Seldinger technique.  Evaluation Blood flow good; BP tracing good. Complications: No apparent complications.   Brett Canales Samuel Peterson ACNP [facility] Pager [phone] till 3 pm If no answer page [phone] 09/18/2019, 11:39 AM

## 2019-09-19 ENCOUNTER — Inpatient Hospital Stay (HOSPITAL_COMMUNITY): Payer: Federal, State, Local not specified - PPO

## 2019-09-19 DIAGNOSIS — K567 Ileus, unspecified: Secondary | ICD-10-CM | POA: Diagnosis not present

## 2019-09-19 DIAGNOSIS — J9601 Acute respiratory failure with hypoxia: Secondary | ICD-10-CM | POA: Diagnosis not present

## 2019-09-19 DIAGNOSIS — U071 COVID-19: Secondary | ICD-10-CM | POA: Diagnosis not present

## 2019-09-19 LAB — CBC WITH DIFFERENTIAL/PLATELET
Abs Immature Granulocytes: 0.08 10*3/uL — ABNORMAL HIGH (ref 0.00–0.07)
Basophils Absolute: 0 10*3/uL (ref 0.0–0.1)
Basophils Relative: 0 %
Eosinophils Absolute: 0 10*3/uL (ref 0.0–0.5)
Eosinophils Relative: 0 %
HCT: 41.5 % (ref 39.0–52.0)
Hemoglobin: 14 g/dL (ref 13.0–17.0)
Immature Granulocytes: 1 %
Lymphocytes Relative: 2 %
Lymphs Abs: 0.3 10*3/uL — ABNORMAL LOW (ref 0.7–4.0)
MCH: 32.6 pg (ref 26.0–34.0)
MCHC: 33.7 g/dL (ref 30.0–36.0)
MCV: 96.7 fL (ref 80.0–100.0)
Monocytes Absolute: 0.4 10*3/uL (ref 0.1–1.0)
Monocytes Relative: 3 %
Neutro Abs: 13.4 10*3/uL — ABNORMAL HIGH (ref 1.7–7.7)
Neutrophils Relative %: 94 %
Platelets: 104 10*3/uL — ABNORMAL LOW (ref 150–400)
RBC: 4.29 MIL/uL (ref 4.22–5.81)
RDW: 11.7 % (ref 11.5–15.5)
WBC: 14.2 10*3/uL — ABNORMAL HIGH (ref 4.0–10.5)
nRBC: 0 % (ref 0.0–0.2)

## 2019-09-19 LAB — COMPREHENSIVE METABOLIC PANEL
ALT: 38 U/L (ref 0–44)
AST: 19 U/L (ref 15–41)
Albumin: 2.6 g/dL — ABNORMAL LOW (ref 3.5–5.0)
Alkaline Phosphatase: 73 U/L (ref 38–126)
Anion gap: 10 (ref 5–15)
BUN: 35 mg/dL — ABNORMAL HIGH (ref 6–20)
CO2: 27 mmol/L (ref 22–32)
Calcium: 8.3 mg/dL — ABNORMAL LOW (ref 8.9–10.3)
Chloride: 100 mmol/L (ref 98–111)
Creatinine, Ser: 1 mg/dL (ref 0.61–1.24)
GFR calc Af Amer: >60 mL/min (ref >60)
GFR calc non Af Amer: >60 mL/min (ref >60)
Glucose, Bld: 202 mg/dL — ABNORMAL HIGH (ref 70–99)
Potassium: 5.1 mmol/L (ref 3.5–5.1)
Sodium: 137 mmol/L (ref 135–145)
Total Bilirubin: 1.4 mg/dL — ABNORMAL HIGH (ref 0.3–1.2)
Total Protein: 6.7 g/dL (ref 6.5–8.1)

## 2019-09-19 LAB — MAGNESIUM: Magnesium: 2.5 mg/dL — ABNORMAL HIGH (ref 1.7–2.4)

## 2019-09-19 LAB — FERRITIN: Ferritin: 922 ng/mL — ABNORMAL HIGH (ref 24–336)

## 2019-09-19 LAB — GLUCOSE, CAPILLARY
Glucose-Capillary: 109 mg/dL — ABNORMAL HIGH (ref 70–99)
Glucose-Capillary: 131 mg/dL — ABNORMAL HIGH (ref 70–99)
Glucose-Capillary: 141 mg/dL — ABNORMAL HIGH (ref 70–99)
Glucose-Capillary: 157 mg/dL — ABNORMAL HIGH (ref 70–99)
Glucose-Capillary: 178 mg/dL — ABNORMAL HIGH (ref 70–99)
Glucose-Capillary: 204 mg/dL — ABNORMAL HIGH (ref 70–99)

## 2019-09-19 LAB — D-DIMER, QUANTITATIVE: D-Dimer, Quant: >20 ug/mL-FEU — ABNORMAL HIGH (ref 0.00–0.50)

## 2019-09-19 LAB — PHOSPHORUS: Phosphorus: 3.8 mg/dL (ref 2.5–4.6)

## 2019-09-19 LAB — C-REACTIVE PROTEIN: CRP: 10.2 mg/dL — ABNORMAL HIGH (ref <1.0)

## 2019-09-19 MED ORDER — BETHANECHOL CHLORIDE 10 MG PO TABS
10.0000 mg | ORAL_TABLET | Freq: Three times a day (TID) | ORAL | Status: DC
  Administered 2019-09-19 – 2019-09-29 (×32): 10 mg
  Filled 2019-09-19 (×33): qty 1

## 2019-09-19 MED ORDER — INSULIN ASPART 100 UNIT/ML ~~LOC~~ SOLN
0.0000 [IU] | SUBCUTANEOUS | Status: DC
  Administered 2019-09-19 (×2): 2 [IU] via SUBCUTANEOUS
  Administered 2019-09-19: 4 [IU] via SUBCUTANEOUS
  Administered 2019-09-19 (×3): 3 [IU] via SUBCUTANEOUS
  Administered 2019-09-20 (×2): 2 [IU] via SUBCUTANEOUS
  Administered 2019-09-21 (×2): 3 [IU] via SUBCUTANEOUS
  Administered 2019-09-21: 2 [IU] via SUBCUTANEOUS
  Administered 2019-09-21: 3 [IU] via SUBCUTANEOUS
  Administered 2019-09-21: 2 [IU] via SUBCUTANEOUS
  Administered 2019-09-22 – 2019-09-23 (×12): 3 [IU] via SUBCUTANEOUS
  Administered 2019-09-24: 2 [IU] via SUBCUTANEOUS
  Administered 2019-09-24: 3 [IU] via SUBCUTANEOUS
  Administered 2019-09-24: 5 [IU] via SUBCUTANEOUS
  Administered 2019-09-24 – 2019-09-25 (×7): 3 [IU] via SUBCUTANEOUS
  Administered 2019-09-25 (×2): 2 [IU] via SUBCUTANEOUS
  Administered 2019-09-26: 5 [IU] via SUBCUTANEOUS
  Administered 2019-09-26: 3 [IU] via SUBCUTANEOUS
  Administered 2019-09-26: 5 [IU] via SUBCUTANEOUS
  Administered 2019-09-26 (×3): 3 [IU] via SUBCUTANEOUS
  Administered 2019-09-26 – 2019-09-27 (×2): 2 [IU] via SUBCUTANEOUS
  Administered 2019-09-27: 5 [IU] via SUBCUTANEOUS
  Administered 2019-09-27 – 2019-09-28 (×2): 3 [IU] via SUBCUTANEOUS
  Administered 2019-09-28: 2 [IU] via SUBCUTANEOUS
  Administered 2019-09-28: 3 [IU] via SUBCUTANEOUS
  Administered 2019-09-28: 2 [IU] via SUBCUTANEOUS
  Administered 2019-09-29: 5 [IU] via SUBCUTANEOUS
  Administered 2019-09-29 (×2): 3 [IU] via SUBCUTANEOUS
  Administered 2019-09-29: 5 [IU] via SUBCUTANEOUS

## 2019-09-19 MED ORDER — ATROPINE SULFATE 1 MG/10ML IJ SOSY
PREFILLED_SYRINGE | INTRAMUSCULAR | Status: AC
  Filled 2019-09-19: qty 10

## 2019-09-19 MED ORDER — ALBUTEROL SULFATE (2.5 MG/3ML) 0.083% IN NEBU: 2.5000 mg | INHALATION_SOLUTION | Freq: Four times a day (QID) | RESPIRATORY_TRACT | Status: DC | PRN

## 2019-09-19 NOTE — Progress Notes (Signed)
NAME:  Samuel Peterson, MRN:  [id number], DOB:  03-Apr-1965, LOS: 9 ADMISSION DATE:  04-Oct-2019, CONSULTATION DATE:  09/14/19 REFERRING MD:  Criselda Peaches, CHIEF COMPLAINT:  COVID-19   Brief History   54yo male with PMH of PE on eliquis and recurrent respiratory infections not requiring hospitalization presenting with covid-19 pneumonia, first diagnosed 8/26, admitted with worsening shortness of breath and new oxygen requirement.   History of present illness   PCCM consulted on 9/4 for worsening hypoxia with ambulation. Was maintaining O2 sats and breathing comfortably on HHFNC. No indication for intubation at that time. Re-consulted 09/16/19 for increased work of breathing and oxygen requirement.   Past Medical History  PE OSA on CPAP  Significant Hospital Events    8/31>admitted 9/6>ICU transfer   Consults:  none  Procedures:  09/17/2019 intubation>> 09/18/2019 plan for arterial line  Significant Diagnostic Tests:  8/31 CXR> diffuse bilateral interstitial opacities  09/18/2019 chest x-ray with bilateral airspace disease crepitation 09/19/2019 CXR - Only mild bilateral airspace disease.  Micro Data:  8/31 Blood cultures x2 >>NGTD   Antimicrobials:    Interim history/subjective:   Episode of emesis with repositioning and coughing.   Objective   Blood pressure 126/70, pulse 78, temperature (!) 100.9 F (38.3 C), temperature source Axillary, resp. rate 18, height 5' 9.5" (1.765 m), weight 104.2 kg, SpO2 91 %.    Vent Mode: PRVC FiO2 (%):  [50 %-60 %] 50 % Set Rate:  [24 bmp] 24 bmp Vt Set:  [430 mL] 430 mL PEEP:  [14 cmH20-15 cmH20] 14 cmH20 Plateau Pressure:  [20 cmH20-32 cmH20] 23 cmH20   Intake/Output Summary (Last 24 hours) at 09/19/2019 1030 Last data filed at 09/19/2019 1000 Gross per 24 hour  Intake 1703.76 ml  Output 2405 ml  Net -701.24 ml   Filed Weights   09/13/19 0605 09/18/19 0500 09/19/19 0417  Weight: 110.2 kg 103.9 kg 104.2 kg    Examination: General:  Middle-aged male sedated on full mechanical ventilatory support HEENT: Endotracheal tube is in place feeding tube is in place Neuro: Sedated CV: Heart sounds are regular regular rate rhythm.  Some bradycardia noted with vagal stimulation PULM: Normal vesicular breath sounds throughout. Acceptable airway pressures.  GI: soft, bsx4 active GU: Amber urine Extremities: warm/dry,  edema  Skin: no rashes or lesions  Resolved Hospital Problem list     Assessment & Plan:   Critically ill due to acute hypoxic respiratory failure due to COVID-19 Pneumonia - On Lung protective ventilation with acceptable PF ratio and airway pressures.  - Wean PEEP at O2 sats allow, unlikely to be able to decrease more than 2 cmH2O per day.  - Maintain current sedation of RASS-3 until PEEP at 10.  COVID-19 pneumonia -Remdesivir and dexamethasone completed -Completing course of baricitinib -May come off isolation on: 9/18 (tested positive for Covid 8/26)  History of PE Continue anticoagulation with Eliquis   Daily Goals Checklist  Pain/Anxiety/Delirium protocol (if indicated): Continue Dilaudid and dexmedetomidine, RASS -3 VAP protocol (if indicated): bundle in place.  Respiratory support goals: Wean PEEP to keep SPO2 greater than 88 Blood pressure target: Currently on no vasopressors. DVT prophylaxis: On full dose Eliquis Nutrition Status: High nutritional risk, vomiting on tube feeds.  Consider prokinetic if further emesis on restart. GI prophylaxis: Protonix Fluid status goals: Allow autoregulation Urinary catheter: Can transition to external catheter Central lines: Arterial line.  Peripheral IVs only Glucose control: Improving glycemic control on basal bolus insulin Mobility/therapy needs: Bedrest Antibiotic de-escalation: Completing baricitinib.  Remdesivir and steroids completed. Home medication reconciliation: On hold Daily labs: Daily CBC, CMP, ABG ordered Code Status: Full Family  Communication: Wife was updated again today.  Told that he appeared to be improving and that anticipated approximately another week on mechanical ventilation. Disposition: ICU   Labs   CBC: Recent Labs  Lab 09/14/19 0130 09/14/19 0130 09/15/19 0702 09/15/19 0702 09/16/19 0118 09/17/19 0500 09/17/19 2030 09/18/19 0820 09/19/19 0356  WBC 8.8   < > 13.7*  --  17.3* 20.3*  --  18.0* 14.2*  NEUTROABS 6.5  --  11.2*  --   --  18.3*  --  16.0* 13.4*  HGB 15.4   < > 15.7   < > 15.5 16.1 13.9 15.0 14.0  HCT 46.2   < > 46.4   < > 44.9 47.8 41.0 44.4 41.5  MCV 94.3   < > 93.7  --  94.1 93.7  --  96.9 96.7  PLT 158   < > 139*  --  125* 122*  --  105* 104*   < > = values in this interval not displayed.    Basic Metabolic Panel: Recent Labs  Lab 09/12/19 1355 09/12/19 1355 09/13/19 1622 09/14/19 0130 09/15/19 0702 09/15/19 0702 09/16/19 0118 09/17/19 0500 09/17/19 1952 09/17/19 2030 09/18/19 0820 09/19/19 0356  NA 139   < > 135   < > 140   < > 137 136  --  130* 134* 137  K 3.7   < > 4.3   < > 4.1   < > 4.4 4.3  --  5.3* 5.0 5.1  CL 102   < > 102   < > 100  --  100 97*  --   --  96* 100  CO2 29   < > 23   < > 30  --  27 28  --   --  26 27  GLUCOSE 122*   < > 219*   < > 117*  --  155* 100*  --   --  142* 202*  BUN 25*   < > 22*   < > 18  --  23* 18  --   --  32* 35*  CREATININE 1.11   < > 1.17   < > 0.97  --  1.15 1.01  --   --  1.20 1.00  CALCIUM 8.7*   < > 8.8*   < > 8.9  --  8.7* 8.9  --   --  8.5* 8.3*  MG 2.0   < > 2.0  --   --   --  2.2  --  2.3  --  2.8* 2.5*  PHOS 3.4  --   --   --   --   --   --   --  6.5*  --  6.5* 3.8   < > = values in this interval not displayed.   GFR: Estimated Creatinine Clearance: 102.5 mL/min (by C-G formula based on SCr of 1 mg/dL). Recent Labs  Lab 09/16/19 0118 09/16/19 0759 09/17/19 0500 09/18/19 0820 09/19/19 0356  PROCALCITON  --  <0.10  --   --   --   WBC 17.3*  --  20.3* 18.0* 14.2*    Liver Function Tests: Recent Labs  Lab  09/15/19 0702 09/16/19 0118 09/17/19 0500 09/18/19 0820 09/19/19 0356  AST 40 36 44* 25 19  ALT 79* 64* 56* 48* 38  ALKPHOS 83 80 97 87 73  BILITOT 1.8*  1.5* 2.8* 1.9* 1.4*  PROT 6.8 6.3* 6.9 6.9 6.7  ALBUMIN 3.3* 3.1* 3.2* 2.8* 2.6*   No results for input(s): LIPASE, AMYLASE in the last 168 hours. No results for input(s): AMMONIA in the last 168 hours.  ABG    Component Value Date/Time   PHART 7.455 (H) 09/17/2019 2030   PCO2ART 41.3 09/17/2019 2030   PO2ART 143 (H) 09/17/2019 2030   HCO3 29.1 (H) 09/17/2019 2030   TCO2 30 09/17/2019 2030   ACIDBASEDEF 2.0 2019-09-14 1059   O2SAT 99.0 09/17/2019 2030     Coagulation Profile: No results for input(s): INR, PROTIME in the last 168 hours.  Cardiac Enzymes: No results for input(s): CKTOTAL, CKMB, CKMBINDEX, TROPONINI in the last 168 hours.  HbA1C: Hgb A1c MFr Bld  Date/Time Value Ref Range Status  09/17/2019 08:18 AM 6.0 (H) 4.8 - 5.6 % Final    Comment:    (NOTE) Pre diabetes:          5.7%-6.4%  Diabetes:              >6.4%  Glycemic control for   <7.0% adults with diabetes     CBG: Recent Labs  Lab 09/18/19 1625 09/18/19 1941 09/18/19 2349 09/19/19 0352 09/19/19 0747  GLUCAP 173* 214* 193* 204* 109*    CRITICAL CARE Performed by: Lynnell Catalan   Total critical care time: 40 minutes  Critical care time was exclusive of separately billable procedures and treating other patients.  Critical care was necessary to treat or prevent imminent or life-threatening deterioration.  Critical care was time spent personally by me on the following activities: development of treatment plan with patient and/or surrogate as well as nursing, discussions with consultants, evaluation of patient's response to treatment, examination of patient, obtaining history from patient or surrogate, ordering and performing treatments and interventions, ordering and review of laboratory studies, ordering and review of radiographic  studies, pulse oximetry, re-evaluation of patient's condition and participation in multidisciplinary rounds.  Lynnell Catalan, MD [facility] ICU Physician [facility]  Pager: [phone] Mobile: [phone] After hours: [phone].   09/19/2019, 10:30 AM

## 2019-09-19 NOTE — Progress Notes (Signed)
eLink Physician-Brief Progress Note Patient Name: Samuel Peterson DOB: 12-Feb-1965 MRN: [id number]   Date of Service  09/19/2019  HPI/Events of Note  Notified of need to change frequency of insulin sliding scale.  Pt is getting tube feeds.  eICU Interventions  Sliding scale changed to q4hrs.     Intervention Category Minor Interventions: Other:  Larinda Buttery 09/19/2019, 12:40 AM

## 2019-09-19 NOTE — Progress Notes (Signed)
eLink Physician-Brief Progress Note Patient Name: MANOLITO JUREWICZ DOB: [birth year] MRN: [id number]   Date of Service  09/19/2019  HPI/Events of Note  Vomiting and not tolerating tube feeds.  eICU Interventions  Enteral nutrition held, stat KUB.        Thomasene Lot Curren Mohrmann 09/19/2019, 10:26 PM

## 2019-09-20 ENCOUNTER — Inpatient Hospital Stay (HOSPITAL_COMMUNITY): Payer: Federal, State, Local not specified - PPO

## 2019-09-20 DIAGNOSIS — Z4682 Encounter for fitting and adjustment of non-vascular catheter: Secondary | ICD-10-CM | POA: Diagnosis not present

## 2019-09-20 DIAGNOSIS — U071 COVID-19: Secondary | ICD-10-CM | POA: Diagnosis not present

## 2019-09-20 DIAGNOSIS — J9601 Acute respiratory failure with hypoxia: Secondary | ICD-10-CM | POA: Diagnosis not present

## 2019-09-20 LAB — CBC
HCT: 44.3 % (ref 39.0–52.0)
Hemoglobin: 14.6 g/dL (ref 13.0–17.0)
MCH: 32.7 pg (ref 26.0–34.0)
MCHC: 33 g/dL (ref 30.0–36.0)
MCV: 99.1 fL (ref 80.0–100.0)
Platelets: 146 10*3/uL — ABNORMAL LOW (ref 150–400)
RBC: 4.47 MIL/uL (ref 4.22–5.81)
RDW: 11.7 % (ref 11.5–15.5)
WBC: 15.5 10*3/uL — ABNORMAL HIGH (ref 4.0–10.5)
nRBC: 0 % (ref 0.0–0.2)

## 2019-09-20 LAB — POCT I-STAT 7, (LYTES, BLD GAS, ICA,H+H)
Acid-Base Excess: 4 mmol/L — ABNORMAL HIGH (ref 0.0–2.0)
Bicarbonate: 31.3 mmol/L — ABNORMAL HIGH (ref 20.0–28.0)
Calcium, Ion: 1.11 mmol/L — ABNORMAL LOW (ref 1.15–1.40)
HCT: 36 % — ABNORMAL LOW (ref 39.0–52.0)
Hemoglobin: 12.2 g/dL — ABNORMAL LOW (ref 13.0–17.0)
O2 Saturation: 91 %
Patient temperature: 99.1
Potassium: 4.2 mmol/L (ref 3.5–5.1)
Sodium: 140 mmol/L (ref 135–145)
TCO2: 33 mmol/L — ABNORMAL HIGH (ref 22–32)
pCO2 arterial: 56.3 mmHg — ABNORMAL HIGH (ref 32.0–48.0)
pH, Arterial: 7.354 (ref 7.350–7.450)
pO2, Arterial: 66 mmHg — ABNORMAL LOW (ref 83.0–108.0)

## 2019-09-20 LAB — GLUCOSE, CAPILLARY
Glucose-Capillary: 135 mg/dL — ABNORMAL HIGH (ref 70–99)
Glucose-Capillary: 113 mg/dL — ABNORMAL HIGH (ref 70–99)
Glucose-Capillary: 123 mg/dL — ABNORMAL HIGH (ref 70–99)
Glucose-Capillary: 98 mg/dL (ref 70–99)
Glucose-Capillary: 101 mg/dL — ABNORMAL HIGH (ref 70–99)
Glucose-Capillary: 104 mg/dL — ABNORMAL HIGH (ref 70–99)

## 2019-09-20 LAB — COMPREHENSIVE METABOLIC PANEL
ALT: 53 U/L — ABNORMAL HIGH (ref 0–44)
AST: 31 U/L (ref 15–41)
Albumin: 2.5 g/dL — ABNORMAL LOW (ref 3.5–5.0)
Alkaline Phosphatase: 79 U/L (ref 38–126)
Anion gap: 9 (ref 5–15)
BUN: 42 mg/dL — ABNORMAL HIGH (ref 6–20)
CO2: 31 mmol/L (ref 22–32)
Calcium: 8.6 mg/dL — ABNORMAL LOW (ref 8.9–10.3)
Chloride: 100 mmol/L (ref 98–111)
Creatinine, Ser: 1.04 mg/dL (ref 0.61–1.24)
GFR calc Af Amer: >60 mL/min (ref >60)
GFR calc non Af Amer: >60 mL/min (ref >60)
Glucose, Bld: 121 mg/dL — ABNORMAL HIGH (ref 70–99)
Potassium: 4.7 mmol/L (ref 3.5–5.1)
Sodium: 140 mmol/L (ref 135–145)
Total Bilirubin: 2.3 mg/dL — ABNORMAL HIGH (ref 0.3–1.2)
Total Protein: 6.5 g/dL (ref 6.5–8.1)

## 2019-09-20 MED ORDER — METOCLOPRAMIDE HCL 5 MG/ML IJ SOLN
10.0000 mg | Freq: Four times a day (QID) | INTRAMUSCULAR | Status: DC
  Administered 2019-09-20 – 2019-09-23 (×13): 10 mg via INTRAVENOUS
  Filled 2019-09-20 (×13): qty 2

## 2019-09-20 MED ORDER — PROPOFOL 1000 MG/100ML IV EMUL
5.0000 ug/kg/min | INTRAVENOUS | Status: DC
  Administered 2019-09-20 (×2): 35 ug/kg/min via INTRAVENOUS
  Administered 2019-09-20: 15 ug/kg/min via INTRAVENOUS
  Administered 2019-09-21: 20 ug/kg/min via INTRAVENOUS
  Filled 2019-09-20 (×5): qty 100

## 2019-09-20 NOTE — Procedures (Addendum)
Cortrak  Person Inserting Tube:  Andalyn Heckstall, RD Tube Type:  Cortrak - 43 inches Tube Location:  Right nare Initial Placement:  Postpyloric Secured by: Bridle Technique Used to Measure Tube Placement:  Documented cm marking at nare/ corner of mouth Cortrak Secured At:  78 cm   X-ray is required, abdominal x-ray has been ordered by the Cortrak team. Please confirm tube placement before using the Cortrak tube.   If the tube becomes dislodged please keep the tube and contact the Cortrak team at [email or web address] (password [email or web address]) for replacement.  If after hours and replacement cannot be delayed, place a NG tube and confirm placement with an abdominal x-ray.    Vanessa Kick RD, LDN Clinical Nutrition Pager listed in AMION

## 2019-09-20 NOTE — Progress Notes (Signed)
NAME:  Samuel Peterson, MRN:  [id number], DOB:  1965/11/04, LOS: 10 ADMISSION DATE:  2019/09/19, CONSULTATION DATE:  09/14/19 REFERRING MD:  Criselda Peaches, CHIEF COMPLAINT:  COVID-19   Brief History   54yo male with PMH of PE on eliquis and recurrent respiratory infections not requiring hospitalization presenting with covid-19 pneumonia, first diagnosed 8/26, admitted with worsening shortness of breath and new oxygen requirement.   History of present illness   PCCM consulted on 9/4 for worsening hypoxia with ambulation. Was maintaining O2 sats and breathing comfortably on HHFNC. No indication for intubation at that time. Re-consulted 09/16/19 for increased work of breathing and oxygen requirement.   Past Medical History  PE OSA on CPAP  Significant Hospital Events    8/31>admitted 9/6>ICU transfer   Consults:  none  Procedures:  09/17/2019 intubation>> 09/18/2019 plan for arterial line  Significant Diagnostic Tests:  8/31 CXR> diffuse bilateral interstitial opacities  09/18/2019 chest x-ray with bilateral airspace disease crepitation 09/20/2019 CXR -bilateral interstitial airspace disease with minimal progression 09/20/2019 KUB-colonic dilatation consistent with ileus. Micro Data:  8/31 Blood cultures x2 >>NGTD  Antimicrobials:  Baricitinib  Interim history/subjective:   Continued episodes of tube feed intolerance. Feeds on hold.   Objective   Blood pressure 118/67, pulse 81, temperature (!) 97.5 F (36.4 C), temperature source Axillary, resp. rate 20, height 5' 9.5" (1.765 m), weight 102.4 kg, SpO2 (!) 84 %.    Vent Mode: PRVC FiO2 (%):  [50 %] 50 % Set Rate:  [24 bmp] 24 bmp Vt Set:  [430 mL] 430 mL PEEP:  [14 cmH20] 14 cmH20 Plateau Pressure:  [20 cmH20-26 cmH20] 26 cmH20   Intake/Output Summary (Last 24 hours) at 09/20/2019 1100 Last data filed at 09/20/2019 1000 Gross per 24 hour  Intake 952.45 ml  Output 1945 ml  Net -992.55 ml   Filed Weights   09/18/19 0500 09/19/19  0417 09/20/19 0500  Weight: 103.9 kg 104.2 kg 102.4 kg    Examination: General: Middle-aged male sedated on full mechanical ventilatory support HEENT: Endotracheal tube is in place.  Minimal NGT drainage.  Neuro: Sedated, opens eyes weakly to voice.  CV: Heart sounds are regular regular rate rhythm.  Some bradycardia noted with vagal stimulation PULM: Normal vesicular breath sounds throughout. Acceptable airway pressures.  GI: soft, rare bowel sounds.  GU: Amber urine Extremities: warm/dry,  edema  Skin: no rashes or lesions  Resolved Hospital Problem list     Assessment & Plan:   Critically ill due to acute hypoxic respiratory failure due to COVID-19 Pneumonia - On Lung protective ventilation with acceptable PF ratio and airway pressures.  - Wean PEEP at O2 sats allow, unlikely to be able to decrease more than 2 cmH2O per day.  - Maintain current sedation of RASS-3 until PEEP at 10. - Trial of diuresis today.   COVID-19 pneumonia -Remdesivir and dexamethasone completed -Completing course of baricitinib -May come off isolation on: 9/18 (tested positive for Covid 8/26)  History of PE Continue anticoagulation with Eliquis  Ileus with tube feed intolerance  -Postpyloric feeding tube -Trial of prokinetic  Episodes of bradycardia, appear to be vagally mediated -Wean Precedex to off if possible -As needed Versed.  Daily Goals Checklist  Pain/Anxiety/Delirium protocol (if indicated): Continue Dilaudid and dexmedetomidine, RASS -3 VAP protocol (if indicated): bundle in place.  Respiratory support goals: Wean PEEP to keep SPO2 greater than 88 Blood pressure target: Currently on no vasopressors. DVT prophylaxis: On full dose Eliquis Nutrition Status: High nutritional risk, vomiting  on tube feeds.  Postpyloric feeding tube, start Reglan.   GI prophylaxis: Protonix Fluid status goals: Allow autoregulation Urinary catheter: External catheter Central lines: Arterial line.   Peripheral IVs only Glucose control: Improving glycemic control on basal bolus insulin Mobility/therapy needs: Bedrest Antibiotic de-escalation: Completing baricitinib.  Remdesivir and steroids completed. Home medication reconciliation: On hold Daily labs: Daily CBC, CMP, ABG ordered Code Status: Full Family Communication: Wife was updated again today.  Told that his condition is about the same as yesterday and that progress is likely to be slow at this point.  And that anticipated approximately another week on mechanical ventilation. Disposition: ICU   Labs   CBC: Recent Labs  Lab 09/14/19 0130 09/14/19 0130 09/15/19 7989 09/15/19 0702 09/16/19 0118 09/16/19 0118 09/17/19 0500 09/17/19 2030 09/18/19 0820 09/19/19 0356 09/20/19 0319  WBC 8.8   < > 13.7*  --  17.3*  --  20.3*  --  18.0* 14.2*  --   NEUTROABS 6.5  --  11.2*  --   --   --  18.3*  --  16.0* 13.4*  --   HGB 15.4   < > 15.7   < > 15.5   < > 16.1 13.9 15.0 14.0 12.2*  HCT 46.2   < > 46.4   < > 44.9   < > 47.8 41.0 44.4 41.5 36.0*  MCV 94.3   < > 93.7  --  94.1  --  93.7  --  96.9 96.7  --   PLT 158   < > 139*  --  125*  --  122*  --  105* 104*  --    < > = values in this interval not displayed.    Basic Metabolic Panel: Recent Labs  Lab 09/13/19 1622 09/14/19 0130 09/16/19 0118 09/16/19 0118 09/17/19 0500 09/17/19 0500 09/17/19 1952 09/17/19 2030 09/18/19 0820 09/19/19 0356 09/20/19 0319 09/20/19 0559  NA 135   < > 137   < > 136   < >  --  130* 134* 137 140 140  K 4.3   < > 4.4   < > 4.3   < >  --  5.3* 5.0 5.1 4.2 4.7  CL 102   < > 100  --  97*  --   --   --  96* 100  --  100  CO2 23   < > 27  --  28  --   --   --  26 27  --  31  GLUCOSE 219*   < > 155*  --  100*  --   --   --  142* 202*  --  121*  BUN 22*   < > 23*  --  18  --   --   --  32* 35*  --  42*  CREATININE 1.17   < > 1.15  --  1.01  --   --   --  1.20 1.00  --  1.04  CALCIUM 8.8*   < > 8.7*  --  8.9  --   --   --  8.5* 8.3*  --  8.6*  MG  2.0  --  2.2  --   --   --  2.3  --  2.8* 2.5*  --   --   PHOS  --   --   --   --   --   --  6.5*  --  6.5* 3.8  --   --    < > =  values in this interval not displayed.   GFR: Estimated Creatinine Clearance: 97.7 mL/min (by C-G formula based on SCr of 1.04 mg/dL). Recent Labs  Lab 09/16/19 0118 09/16/19 0759 09/17/19 0500 09/18/19 0820 09/19/19 0356  PROCALCITON  --  <0.10  --   --   --   WBC 17.3*  --  20.3* 18.0* 14.2*    Liver Function Tests: Recent Labs  Lab 09/16/19 0118 09/17/19 0500 09/18/19 0820 09/19/19 0356 09/20/19 0559  AST 36 44* 25 19 31   ALT 64* 56* 48* 38 53*  ALKPHOS 80 97 87 73 79  BILITOT 1.5* 2.8* 1.9* 1.4* 2.3*  PROT 6.3* 6.9 6.9 6.7 6.5  ALBUMIN 3.1* 3.2* 2.8* 2.6* 2.5*   No results for input(s): LIPASE, AMYLASE in the last 168 hours. No results for input(s): AMMONIA in the last 168 hours.  ABG    Component Value Date/Time   PHART 7.354 09/20/2019 0319   PCO2ART 56.3 (H) 09/20/2019 0319   PO2ART 66 (L) 09/20/2019 0319   HCO3 31.3 (H) 09/20/2019 0319   TCO2 33 (H) 09/20/2019 0319   ACIDBASEDEF 2.0 08/25/2019 1059   O2SAT 91.0 09/20/2019 0319     Coagulation Profile: No results for input(s): INR, PROTIME in the last 168 hours.  Cardiac Enzymes: No results for input(s): CKTOTAL, CKMB, CKMBINDEX, TROPONINI in the last 168 hours.  HbA1C: Hgb A1c MFr Bld  Date/Time Value Ref Range Status  09/17/2019 08:18 AM 6.0 (H) 4.8 - 5.6 % Final    Comment:    (NOTE) Pre diabetes:          5.7%-6.4%  Diabetes:              >6.4%  Glycemic control for   <7.0% adults with diabetes     CBG: Recent Labs  Lab 09/19/19 1526 09/19/19 1935 09/19/19 2331 09/20/19 0342 09/20/19 0741  GLUCAP 157* 178* 141* 135* 113*    CRITICAL CARE Performed by: 11/20/19   Total critical care time: 35 minutes  Critical care time was exclusive of separately billable procedures and treating other patients.  Critical care was necessary to treat or  prevent imminent or life-threatening deterioration.  Critical care was time spent personally by me on the following activities: development of treatment plan with patient and/or surrogate as well as nursing, discussions with consultants, evaluation of patient's response to treatment, examination of patient, obtaining history from patient or surrogate, ordering and performing treatments and interventions, ordering and review of laboratory studies, ordering and review of radiographic studies, pulse oximetry, re-evaluation of patient's condition and participation in multidisciplinary rounds.  Lynnell Catalan, MD [facility] ICU Physician [facility] [place] [facility]  Pager: [phone] Mobile: [phone] After hours: [phone].   09/20/2019, 11:00 AM

## 2019-09-20 NOTE — Progress Notes (Signed)
This note also relates to the following rows which could not be included: SpO2 - Cannot attach notes to unvalidated device data  RT NOTE: RT advanced ET tube 1cm to 27 at the lips per order. Vitals are stable. RT will continue to monitor.

## 2019-09-21 ENCOUNTER — Inpatient Hospital Stay (HOSPITAL_COMMUNITY): Payer: Federal, State, Local not specified - PPO

## 2019-09-21 DIAGNOSIS — Z9911 Dependence on respirator [ventilator] status: Secondary | ICD-10-CM | POA: Diagnosis not present

## 2019-09-21 DIAGNOSIS — J15211 Pneumonia due to Methicillin susceptible Staphylococcus aureus: Secondary | ICD-10-CM | POA: Diagnosis not present

## 2019-09-21 DIAGNOSIS — R739 Hyperglycemia, unspecified: Secondary | ICD-10-CM

## 2019-09-21 DIAGNOSIS — U071 COVID-19: Secondary | ICD-10-CM | POA: Diagnosis not present

## 2019-09-21 DIAGNOSIS — J80 Acute respiratory distress syndrome: Secondary | ICD-10-CM | POA: Diagnosis not present

## 2019-09-21 DIAGNOSIS — J9601 Acute respiratory failure with hypoxia: Secondary | ICD-10-CM | POA: Diagnosis not present

## 2019-09-21 DIAGNOSIS — J1282 Pneumonia due to coronavirus disease 2019: Secondary | ICD-10-CM | POA: Diagnosis not present

## 2019-09-21 LAB — POCT I-STAT 7, (LYTES, BLD GAS, ICA,H+H)
Acid-Base Excess: 2 mmol/L (ref 0.0–2.0)
Bicarbonate: 30.9 mmol/L — ABNORMAL HIGH (ref 20.0–28.0)
Calcium, Ion: 1.06 mmol/L — ABNORMAL LOW (ref 1.15–1.40)
HCT: 44 % (ref 39.0–52.0)
Hemoglobin: 15 g/dL (ref 13.0–17.0)
O2 Saturation: 91 %
Patient temperature: 100.5
Potassium: 4.7 mmol/L (ref 3.5–5.1)
Sodium: 139 mmol/L (ref 135–145)
TCO2: 33 mmol/L — ABNORMAL HIGH (ref 22–32)
pCO2 arterial: 71.7 mmHg (ref 32.0–48.0)
pH, Arterial: 7.248 — ABNORMAL LOW (ref 7.350–7.450)
pO2, Arterial: 77 mmHg — ABNORMAL LOW (ref 83.0–108.0)

## 2019-09-21 LAB — BASIC METABOLIC PANEL
Anion gap: 14 (ref 5–15)
BUN: 90 mg/dL — ABNORMAL HIGH (ref 6–20)
CO2: 27 mmol/L (ref 22–32)
Calcium: 8.2 mg/dL — ABNORMAL LOW (ref 8.9–10.3)
Chloride: 98 mmol/L (ref 98–111)
Creatinine, Ser: 3.01 mg/dL — ABNORMAL HIGH (ref 0.61–1.24)
GFR calc Af Amer: 26 mL/min — ABNORMAL LOW (ref >60)
GFR calc non Af Amer: 23 mL/min — ABNORMAL LOW (ref >60)
Glucose, Bld: 175 mg/dL — ABNORMAL HIGH (ref 70–99)
Potassium: 5.2 mmol/L — ABNORMAL HIGH (ref 3.5–5.1)
Sodium: 139 mmol/L (ref 135–145)

## 2019-09-21 LAB — CBC
HCT: 46.7 % (ref 39.0–52.0)
Hemoglobin: 14.9 g/dL (ref 13.0–17.0)
MCH: 32.5 pg (ref 26.0–34.0)
MCHC: 31.9 g/dL (ref 30.0–36.0)
MCV: 102 fL — ABNORMAL HIGH (ref 80.0–100.0)
Platelets: 196 10*3/uL (ref 150–400)
RBC: 4.58 MIL/uL (ref 4.22–5.81)
RDW: 12.1 % (ref 11.5–15.5)
WBC: 17.1 10*3/uL — ABNORMAL HIGH (ref 4.0–10.5)
nRBC: 0 % (ref 0.0–0.2)

## 2019-09-21 LAB — GLUCOSE, CAPILLARY
Glucose-Capillary: 123 mg/dL — ABNORMAL HIGH (ref 70–99)
Glucose-Capillary: 139 mg/dL — ABNORMAL HIGH (ref 70–99)
Glucose-Capillary: 156 mg/dL — ABNORMAL HIGH (ref 70–99)
Glucose-Capillary: 159 mg/dL — ABNORMAL HIGH (ref 70–99)
Glucose-Capillary: 165 mg/dL — ABNORMAL HIGH (ref 70–99)
Glucose-Capillary: 167 mg/dL — ABNORMAL HIGH (ref 70–99)
Glucose-Capillary: 172 mg/dL — ABNORMAL HIGH (ref 70–99)

## 2019-09-21 LAB — MAGNESIUM: Magnesium: 3.4 mg/dL — ABNORMAL HIGH (ref 1.7–2.4)

## 2019-09-21 LAB — TRIGLYCERIDES: Triglycerides: 321 mg/dL — ABNORMAL HIGH (ref <150)

## 2019-09-21 MED ORDER — MIDAZOLAM HCL 2 MG/2ML IJ SOLN
2.0000 mg | INTRAMUSCULAR | Status: DC | PRN
  Administered 2019-09-23 – 2019-09-28 (×9): 2 mg via INTRAVENOUS
  Filled 2019-09-21 (×7): qty 2

## 2019-09-21 MED ORDER — MIDAZOLAM HCL 2 MG/2ML IJ SOLN
2.0000 mg | INTRAMUSCULAR | Status: DC | PRN
  Filled 2019-09-21 (×2): qty 2

## 2019-09-21 MED ORDER — SENNOSIDES-DOCUSATE SODIUM 8.6-50 MG PO TABS: 1.0000 | ORAL_TABLET | Freq: Every day | ORAL | Status: DC

## 2019-09-21 MED ORDER — ENOXAPARIN SODIUM 100 MG/ML ~~LOC~~ SOLN
1.0000 mg/kg | Freq: Two times a day (BID) | SUBCUTANEOUS | Status: DC
  Administered 2019-09-21: 102.5 mg via SUBCUTANEOUS
  Filled 2019-09-21 (×2): qty 1.02

## 2019-09-21 MED ORDER — SENNOSIDES-DOCUSATE SODIUM 8.6-50 MG PO TABS
1.0000 | ORAL_TABLET | Freq: Every day | ORAL | Status: DC
  Administered 2019-09-21 – 2019-09-25 (×4): 1
  Filled 2019-09-21 (×4): qty 1

## 2019-09-21 MED ORDER — METOPROLOL TARTRATE 5 MG/5ML IV SOLN
2.5000 mg | INTRAVENOUS | Status: DC | PRN
  Administered 2019-09-21 – 2019-09-22 (×3): 2.5 mg via INTRAVENOUS
  Filled 2019-09-21 (×4): qty 5

## 2019-09-21 NOTE — Progress Notes (Signed)
eLink Physician-Brief Progress Note Patient Name: Samuel Peterson DOB: 1965-06-04 MRN: [id number]   Date of Service  09/21/2019  HPI/Events of Note  Sinus Tachycardia - HR = 133. BP = 117/73 with MAP = 130. Does not appear to be on vasopressor. Hgb = 14.9. No CVL or CVP.  eICU Interventions  Plan: 1. Metoprolol 2.5-5 mg IV Q 3 hours PRN.  2. Insure adequate sedation. 3. Treat Fever PRN.      Intervention Category Major Interventions: Arrhythmia - evaluation and management  Ardis Lawley [place] 09/21/2019, 7:59 PM

## 2019-09-21 NOTE — Progress Notes (Signed)
Patient temp 100.5, noted coolness of bilateral lower extremities, pedal pulses +1. eLink made aware, no new orders.

## 2019-09-21 NOTE — Progress Notes (Signed)
Blood pressure improving with sedation off. Patient responding to voice. Propofol restarted at . Current blood pressure 140/61. Elink made aware.

## 2019-09-21 NOTE — Progress Notes (Signed)
NAME:  Samuel Peterson, MRN:  [id number], DOB:  [birth year], LOS: 11 ADMISSION DATE:  09/07/2019, CONSULTATION DATE:  09/14/19 REFERRING MD:  Criselda Peaches, CHIEF COMPLAINT:  COVID-19   Brief History   54yo male with PMH of PE on eliquis and recurrent respiratory infections not requiring hospitalization presenting with covid-19 pneumonia, first diagnosed 8/26, admitted with worsening shortness of breath and new oxygen requirement.   History of present illness   PCCM consulted on 9/4 for worsening hypoxia with ambulation. Was maintaining O2 sats and breathing comfortably on HHFNC. No indication for intubation at that time. Re-consulted 09/16/19 for increased work of breathing and oxygen requirement.   Past Medical History  PE OSA on CPAP  Significant Hospital Events    8/31>admitted 9/6>ICU transfer   Consults:    Procedures:  09/17/2019 intubation>> 09/18/2019 plan for arterial line  Significant Diagnostic Tests:  8/31 CXR> diffuse bilateral interstitial opacities  09/18/2019 chest x-ray with bilateral airspace disease crepitation 09/20/2019 CXR -bilateral interstitial airspace disease with minimal progression 09/20/2019 KUB-colonic dilatation consistent with ileus. Micro Data:  8/31 Blood cultures x2 >>NGTD  Antimicrobials:  remdesivir 8/31> 9/4  Interim history/subjective:  TF held overnight. Hypotension with propofol.  Objective   Blood pressure 114/68, pulse (!) 117, temperature 100.3 F (37.9 C), temperature source Oral, resp. rate (!) 25, height 5' 9.5" (1.765 m), weight 101.6 kg, SpO2 (!) 89 %.    Vent Mode: PRVC FiO2 (%):  [55 %-60 %] 55 % Set Rate:  [24 bmp] 24 bmp Vt Set:  [430 mL] 430 mL PEEP:  [14 cmH20] 14 cmH20 Plateau Pressure:  [25 cmH20-28 cmH20] 26 cmH20   Intake/Output Summary (Last 24 hours) at 09/21/2019 1121 Last data filed at 09/21/2019 1000 Gross per 24 hour  Intake 774.08 ml  Output 2540 ml  Net -1765.92 ml   Filed Weights   09/19/19 0417 09/20/19 0500  09/21/19 0500  Weight: 104.2 kg 102.4 kg 101.6 kg    Examination: General: critically ill appearing man laying in bed intubated, sedated HEENT:  [place]/AT, eyes anicteric, ETT Neuro: RASS -5, PERRL CV: Tachycardic, regular rhythm, no murmurs PULM: Mechanical breath sounds, no rhonchi or wheezing.  Plateau pressure 25 GI: Soft, nontender, nondistended Derm: No rashes or wounds Extremities: No clubbing, cyanosis, no significant peripheral edema  Resolved Hospital Problem list     Assessment & Plan:   Acute hypoxic and hypercapneic respiratory failure due to COVID-19 Pneumonia -Continue lung protective ventilation, 4 to 8 cc/kg ideal body weight with goal plateau less than 30 driving pressure less than 15.  Titrate PEEP and FiO2 per ARDS protocol. -Daily SAT SBT when appropriate -Sedation to tolerate mechanical ventilation-discontinue propofol due to severe hypotension.  Continue with Dilaudid and as needed Versed. -Remdesivir, dexamethasone, Versed and per protocol -May come off isolation on: 9/18 (tested positive for Covid 8/26)  History of PE -transition to lovenox in case he requires procedures such as tracheostomy  Ileus with tube feed intolerance  -Postpyloric feeding tube -Restart tube feeds at 10 cc/h -con't reglan  Episodes of bradycardia, resolved off of Precedex -Continue telemetry monitoring  Hyperglycemia due to steroids -Accu-Cheks every 4 hours with sliding scale insulin -Goal BG 140-180 while admitted to the ICU  Daily Goals Checklist  Pain/Anxiety/Delirium protocol (if indicated): Continue Dilaudid and dexmedetomidine, RASS -3 VAP protocol (if indicated): bundle in place.  Respiratory support goals: Wean PEEP to keep SPO2 greater than 88 Blood pressure target: Currently on no vasopressors. DVT prophylaxis: On full dose Eliquis Nutrition  Status: High nutritional risk, vomiting on tube feeds.  Postpyloric feeding tube, start Reglan.   GI prophylaxis:  Protonix Urinary catheter: External catheter Central lines: Arterial line.  Peripheral IVs only Glucose control: Improving glycemic control on basal bolus insulin Mobility/therapy needs: Bedrest Code Status: Full Family Communication: Wife   Disposition: ICU   Labs   CBC: Recent Labs  Lab 09/15/19 0702 09/15/19 0702 09/16/19 0118 09/16/19 0118 09/17/19 0500 09/17/19 2030 09/18/19 0820 09/19/19 0356 09/20/19 0319 09/20/19 1500 09/21/19 0243  WBC 13.7*   < > 17.3*  --  20.3*  --  18.0* 14.2*  --  15.5*  --   NEUTROABS 11.2*  --   --   --  18.3*  --  16.0* 13.4*  --   --   --   HGB 15.7   < > 15.5   < > 16.1   < > 15.0 14.0 12.2* 14.6 15.0  HCT 46.4   < > 44.9   < > 47.8   < > 44.4 41.5 36.0* 44.3 44.0  MCV 93.7   < > 94.1  --  93.7  --  96.9 96.7  --  99.1  --   PLT 139*   < > 125*  --  122*  --  105* 104*  --  146*  --    < > = values in this interval not displayed.    Basic Metabolic Panel: Recent Labs  Lab 09/16/19 0118 09/16/19 0118 09/17/19 0500 09/17/19 1952 09/17/19 2030 09/18/19 0820 09/19/19 0356 09/20/19 0319 09/20/19 0559 09/21/19 0243  NA 137   < > 136  --    < > 134* 137 140 140 139  K 4.4   < > 4.3  --    < > 5.0 5.1 4.2 4.7 4.7  CL 100  --  97*  --   --  96* 100  --  100  --   CO2 27  --  28  --   --  26 27  --  31  --   GLUCOSE 155*  --  100*  --   --  142* 202*  --  121*  --   BUN 23*  --  18  --   --  32* 35*  --  42*  --   CREATININE 1.15  --  1.01  --   --  1.20 1.00  --  1.04  --   CALCIUM 8.7*  --  8.9  --   --  8.5* 8.3*  --  8.6*  --   MG 2.2  --   --  2.3  --  2.8* 2.5*  --   --   --   PHOS  --   --   --  6.5*  --  6.5* 3.8  --   --   --    < > = values in this interval not displayed.   GFR: Estimated Creatinine Clearance: 97.4 mL/min (by C-G formula based on SCr of 1.04 mg/dL). Recent Labs  Lab 09/16/19 0118 09/16/19 0759 09/17/19 0500 09/18/19 0820 09/19/19 0356 09/20/19 1500  PROCALCITON  --  <0.10  --   --   --   --    WBC   < >  --  20.3* 18.0* 14.2* 15.5*   < > = values in this interval not displayed.    Liver Function Tests: Recent Labs  Lab 09/16/19 0118 09/17/19 0500 09/18/19 0820 09/19/19 0356 09/20/19 0559  AST 36 44* 25 19 31   ALT 64* 56* 48* 38 53*  ALKPHOS 80 97 87 73 79  BILITOT 1.5* 2.8* 1.9* 1.4* 2.3*  PROT 6.3* 6.9 6.9 6.7 6.5  ALBUMIN 3.1* 3.2* 2.8* 2.6* 2.5*   No results for input(s): LIPASE, AMYLASE in the last 168 hours. No results for input(s): AMMONIA in the last 168 hours.  ABG    Component Value Date/Time   PHART 7.248 (L) 09/21/2019 0243   PCO2ART 71.7 (HH) 09/21/2019 0243   PO2ART 77 (L) 09/21/2019 0243   HCO3 30.9 (H) 09/21/2019 0243   TCO2 33 (H) 09/21/2019 0243   ACIDBASEDEF 2.0 2019/09/22 1059   O2SAT 91.0 09/21/2019 0243     Coagulation Profile: No results for input(s): INR, PROTIME in the last 168 hours.  Cardiac Enzymes: No results for input(s): CKTOTAL, CKMB, CKMBINDEX, TROPONINI in the last 168 hours.  HbA1C: Hgb A1c MFr Bld  Date/Time Value Ref Range Status  09/17/2019 08:18 AM 6.0 (H) 4.8 - 5.6 % Final    Comment:    (NOTE) Pre diabetes:          5.7%-6.4%  Diabetes:              >6.4%  Glycemic control for   <7.0% adults with diabetes     CBG: Recent Labs  Lab 09/20/19 1548 09/20/19 1934 09/20/19 2328 09/21/19 0326 09/21/19 0751  GLUCAP 98 101* 104* 123* 139*     This patient is critically ill with multiple organ system failure which requires frequent high complexity decision making, assessment, support, evaluation, and titration of therapies. This was completed through the application of advanced monitoring technologies and extensive interpretation of multiple databases. During this encounter critical care time was devoted to patient care services described in this note for 35 minutes.   11/21/19, DO 09/21/19 11:51 AM [facility]

## 2019-09-21 NOTE — Progress Notes (Signed)
Patient blood pressure continues to trend down. ELink notified, Propofol turned off at this time.

## 2019-09-22 DIAGNOSIS — J9602 Acute respiratory failure with hypercapnia: Secondary | ICD-10-CM

## 2019-09-22 DIAGNOSIS — J9601 Acute respiratory failure with hypoxia: Secondary | ICD-10-CM | POA: Diagnosis not present

## 2019-09-22 DIAGNOSIS — U071 COVID-19: Secondary | ICD-10-CM | POA: Diagnosis not present

## 2019-09-22 DIAGNOSIS — J80 Acute respiratory distress syndrome: Secondary | ICD-10-CM | POA: Diagnosis not present

## 2019-09-22 DIAGNOSIS — Z9911 Dependence on respirator [ventilator] status: Secondary | ICD-10-CM | POA: Diagnosis not present

## 2019-09-22 LAB — POCT I-STAT 7, (LYTES, BLD GAS, ICA,H+H)
Acid-Base Excess: 6 mmol/L — ABNORMAL HIGH (ref 0.0–2.0)
Bicarbonate: 34 mmol/L — ABNORMAL HIGH (ref 20.0–28.0)
Calcium, Ion: 1.14 mmol/L — ABNORMAL LOW (ref 1.15–1.40)
HCT: 42 % (ref 39.0–52.0)
Hemoglobin: 14.3 g/dL (ref 13.0–17.0)
O2 Saturation: 94 %
Patient temperature: 37.7
Potassium: 4.7 mmol/L (ref 3.5–5.1)
Sodium: 145 mmol/L (ref 135–145)
TCO2: 36 mmol/L — ABNORMAL HIGH (ref 22–32)
pCO2 arterial: 66.3 mmHg (ref 32.0–48.0)
pH, Arterial: 7.321 — ABNORMAL LOW (ref 7.350–7.450)
pO2, Arterial: 82 mmHg — ABNORMAL LOW (ref 83.0–108.0)

## 2019-09-22 LAB — GLUCOSE, CAPILLARY
Glucose-Capillary: 151 mg/dL — ABNORMAL HIGH (ref 70–99)
Glucose-Capillary: 155 mg/dL — ABNORMAL HIGH (ref 70–99)
Glucose-Capillary: 156 mg/dL — ABNORMAL HIGH (ref 70–99)
Glucose-Capillary: 161 mg/dL — ABNORMAL HIGH (ref 70–99)
Glucose-Capillary: 165 mg/dL — ABNORMAL HIGH (ref 70–99)
Glucose-Capillary: 190 mg/dL — ABNORMAL HIGH (ref 70–99)

## 2019-09-22 LAB — BASIC METABOLIC PANEL
Anion gap: 11 (ref 5–15)
BUN: 92 mg/dL — ABNORMAL HIGH (ref 6–20)
CO2: 30 mmol/L (ref 22–32)
Calcium: 8.4 mg/dL — ABNORMAL LOW (ref 8.9–10.3)
Chloride: 107 mmol/L (ref 98–111)
Creatinine, Ser: 2.2 mg/dL — ABNORMAL HIGH (ref 0.61–1.24)
GFR calc Af Amer: 38 mL/min — ABNORMAL LOW (ref >60)
GFR calc non Af Amer: 33 mL/min — ABNORMAL LOW (ref >60)
Glucose, Bld: 169 mg/dL — ABNORMAL HIGH (ref 70–99)
Potassium: 4.7 mmol/L (ref 3.5–5.1)
Sodium: 148 mmol/L — ABNORMAL HIGH (ref 135–145)

## 2019-09-22 MED ORDER — FREE WATER
200.0000 mL | Status: DC
  Administered 2019-09-22 – 2019-09-23 (×7): 200 mL

## 2019-09-22 MED ORDER — ENOXAPARIN SODIUM 100 MG/ML ~~LOC~~ SOLN
100.0000 mg | Freq: Two times a day (BID) | SUBCUTANEOUS | Status: DC
  Administered 2019-09-22 – 2019-09-25 (×7): 100 mg via SUBCUTANEOUS
  Filled 2019-09-22 (×8): qty 1

## 2019-09-22 NOTE — Progress Notes (Signed)
Temp increasing to 101.5, ice packs applied.

## 2019-09-22 NOTE — Progress Notes (Signed)
Temp 102.7, HR 128, BP 99/60, Administered PRN Tylenol 650mg  and applied ice bag.

## 2019-09-22 NOTE — Progress Notes (Signed)
NAME:  Samuel Peterson, MRN:  [id number], DOB:  [birth year], LOS: 12 ADMISSION DATE:  08/31/2019, CONSULTATION DATE:  09/14/19 REFERRING MD:  Criselda Peaches, CHIEF COMPLAINT:  COVID-19   Brief History   54yo male with PMH of PE on eliquis and recurrent respiratory infections not requiring hospitalization presenting with covid-19 pneumonia, first diagnosed 8/26, admitted with worsening shortness of breath and new oxygen requirement.   History of present illness   PCCM consulted on 9/4 for worsening hypoxia with ambulation. Was maintaining O2 sats and breathing comfortably on HHFNC. No indication for intubation at that time. Re-consulted 09/16/19 for increased work of breathing and oxygen requirement.   Past Medical History  PE OSA on CPAP  Significant Hospital Events    8/31>admitted 9/6>ICU transfer   Consults:    Procedures:  09/17/2019 intubation>> 09/18/2019  arterial line  Significant Diagnostic Tests:  8/31 CXR> diffuse bilateral interstitial opacities  09/18/2019 chest x-ray with bilateral airspace disease crepitation 09/20/2019 CXR -bilateral interstitial airspace disease with minimal progression 09/20/2019 KUB-colonic dilatation consistent with ileus. Micro Data:  8/31 Blood cultures x2 >>NGTD  Antimicrobials:  remdesivir 8/31> 9/4  Interim history/subjective:  Tachycardic,febrile. Cold feet this AM but palpable pulses.  Objective   Blood pressure 123/83, pulse (!) 111, temperature 98.9 F (37.2 C), temperature source Oral, resp. rate (!) 27, height 5' 9.5" (1.765 m), weight 101.6 kg, SpO2 92 %.    Vent Mode: PRVC FiO2 (%):  [55 %] 55 % Set Rate:  [24 bmp] 24 bmp Vt Set:  [430 mL] 430 mL PEEP:  [14 cmH20] 14 cmH20 Plateau Pressure:  [24 cmH20-26 cmH20] 24 cmH20   Intake/Output Summary (Last 24 hours) at 09/22/2019 1131 Last data filed at 09/22/2019 1000 Gross per 24 hour  Intake 631.75 ml  Output 3050 ml  Net -2418.25 ml   Filed Weights   09/20/19 0500 09/21/19 0500  09/22/19 0500  Weight: 102.4 kg 101.6 kg 101.6 kg    Examination: General: Critically ill-appearing man lying in bed intubated, sedated HEENT: [place]/AT, eyes anicteric, endotracheal tube in place Neuro: RASS -5, strong cough to tracheal suctioning.  PERRL.  No response to verbal stimulation. CV: Tachycardic, regular rhythm, no murmurs PULM: Rales bilaterally, mild dyssynchrony with the vent.  Minimal bloody secretions from ET tube. GI: Soft, nontender, nondistended Derm: No rashes or petechiae Extremities: Cool toes with cyanosis.  Palpable dorsal pedal and posterior tibial pulses.  No significant peripheral edema  Resolved Hospital Problem list     Assessment & Plan:   Acute hypoxic and hypercapneic respiratory failure due to COVID-19 Pneumonia -Continue low tidal volume ventilation, 4 to 8 cc/kg ideal body weight with goal plateau less than 30 and driving pressure less than 15.  Titrate PEEP and FiO2 per ARDS protocol. -Daily SAT and SBT as appropriate. -Sedation as required to tolerate mechanical ventilation.  Intolerant to propofol and Precedex due to bradycardia and hypotension -Remdesivir, dexamethasone per protocol -May come off isolation on: 9/18 (tested positive for Covid 8/26)  Sepsis due to unknown source-possibly pneumonia -cultures, empiric antibitoics -stop baricitinib  History of PE -transition to lovenox from apixaban in case he requires procedures such as tracheostomy  Ileus with tube feed intolerance  -Postpyloric feeding tube -advance TF to goal today -con't reglan; d/c once tolerating goal TF  Episodes of bradycardia, resolved off of Precedex -Continue telemetry monitoring  Hyperglycemia due to steroids -Accu-Cheks every 4 hours with sliding scale insulin -Goal BG 140-180 while admitted to the ICU  AKI-improved.  Likely  2/2 urinary retention (foley replaced 9/11) -renally dose meds, avoid nephrotoxic meds -strict I/os -keep  foley -urecholine -Continue to monitor  Hypernatremia -Free water -Hold additional diuresis; goal to maintain euvolemia  Daily Goals Checklist  Pain/Anxiety/Delirium protocol (if indicated): Continue Dilaudid and dexmedetomidine, goal RASS -3 VAP protocol (if indicated): yes Respiratory support goals: Wean PEEP to keep SPO2 greater than 88 Blood pressure target: Currently on no vasopressors. DVT prophylaxis: lovenox GI prophylaxis: Protonix Urinary catheter: foley for urinary retention Glucose control: basal bolus insulin Mobility/therapy needs: Bedrest Code Status: Full Family Communication: Wife   Disposition: ICU   Labs   CBC: Recent Labs  Lab 09/17/19 0500 09/17/19 2030 09/18/19 0820 09/18/19 0820 09/19/19 0356 09/19/19 0356 09/20/19 0319 09/20/19 1500 09/21/19 0243 09/21/19 1238 09/22/19 0512  WBC 20.3*  --  18.0*  --  14.2*  --   --  15.5*  --  17.1*  --   NEUTROABS 18.3*  --  16.0*  --  13.4*  --   --   --   --   --   --   HGB 16.1   < > 15.0   < > 14.0   < > 12.2* 14.6 15.0 14.9 14.3  HCT 47.8   < > 44.4   < > 41.5   < > 36.0* 44.3 44.0 46.7 42.0  MCV 93.7  --  96.9  --  96.7  --   --  99.1  --  102.0*  --   PLT 122*  --  105*  --  104*  --   --  146*  --  196  --    < > = values in this interval not displayed.    Basic Metabolic Panel: Recent Labs  Lab 09/16/19 0118 09/16/19 0118 09/17/19 0500 09/17/19 1952 09/17/19 2030 09/18/19 0820 09/18/19 0820 09/19/19 2130 09/19/19 8657 09/20/19 0319 09/20/19 0559 09/21/19 0243 09/21/19 1340 09/22/19 0512  NA 137   < > 136  --    < > 134*   < > 137   < > 140 140 139 139 145  K 4.4   < > 4.3  --    < > 5.0   < > 5.1   < > 4.2 4.7 4.7 5.2* 4.7  CL 100   < > 97*  --   --  96*  --  100  --   --  100  --  98  --   CO2 27   < > 28  --   --  26  --  27  --   --  31  --  27  --   GLUCOSE 155*   < > 100*  --   --  142*  --  202*  --   --  121*  --  175*  --   BUN 23*   < > 18  --   --  32*  --  35*  --   --   42*  --  90*  --   CREATININE 1.15   < > 1.01  --   --  1.20  --  1.00  --   --  1.04  --  3.01*  --   CALCIUM 8.7*   < > 8.9  --   --  8.5*  --  8.3*  --   --  8.6*  --  8.2*  --   MG 2.2  --   --  2.3  --  2.8*  --  2.5*  --   --   --   --  3.4*  --   PHOS  --   --   --  6.5*  --  6.5*  --  3.8  --   --   --   --   --   --    < > = values in this interval not displayed.   GFR: Estimated Creatinine Clearance: 33.6 mL/min (A) (by C-G formula based on SCr of 3.01 mg/dL (H)). Recent Labs  Lab 09/16/19 0759 09/17/19 0500 09/18/19 0820 09/19/19 0356 09/20/19 1500 09/21/19 1238  PROCALCITON <0.10  --   --   --   --   --   WBC  --    < > 18.0* 14.2* 15.5* 17.1*   < > = values in this interval not displayed.    Liver Function Tests: Recent Labs  Lab 09/16/19 0118 09/17/19 0500 09/18/19 0820 09/19/19 0356 09/20/19 0559  AST 36 44* 25 19 31   ALT 64* 56* 48* 38 53*  ALKPHOS 80 97 87 73 79  BILITOT 1.5* 2.8* 1.9* 1.4* 2.3*  PROT 6.3* 6.9 6.9 6.7 6.5  ALBUMIN 3.1* 3.2* 2.8* 2.6* 2.5*   No results for input(s): LIPASE, AMYLASE in the last 168 hours. No results for input(s): AMMONIA in the last 168 hours.  ABG    Component Value Date/Time   PHART 7.321 (L) 09/22/2019 0512   PCO2ART 66.3 (HH) 09/22/2019 0512   PO2ART 82 (L) 09/22/2019 0512   HCO3 34.0 (H) 09/22/2019 0512   TCO2 36 (H) 09/22/2019 0512   ACIDBASEDEF 2.0 08/20/2019 1059   O2SAT 94.0 09/22/2019 0512     Coagulation Profile: No results for input(s): INR, PROTIME in the last 168 hours.  Cardiac Enzymes: No results for input(s): CKTOTAL, CKMB, CKMBINDEX, TROPONINI in the last 168 hours.  HbA1C: Hgb A1c MFr Bld  Date/Time Value Ref Range Status  09/17/2019 08:18 AM 6.0 (H) 4.8 - 5.6 % Final    Comment:    (NOTE) Pre diabetes:          5.7%-6.4%  Diabetes:              >6.4%  Glycemic control for   <7.0% adults with diabetes     CBG: Recent Labs  Lab 09/21/19 1858 09/21/19 1933 09/21/19 2343  09/22/19 0332 09/22/19 0746  GLUCAP 159* 156* 172* 156* 151*     This patient is critically ill with multiple organ system failure which requires frequent high complexity decision making, assessment, support, evaluation, and titration of therapies. This was completed through the application of advanced monitoring technologies and extensive interpretation of multiple databases. During this encounter critical care time was devoted to patient care services described in this note for 32 minutes.   11/22/19, DO 09/22/19 3:37 PM [facility]

## 2019-09-23 DIAGNOSIS — J181 Lobar pneumonia, unspecified organism: Secondary | ICD-10-CM

## 2019-09-23 DIAGNOSIS — J1282 Pneumonia due to coronavirus disease 2019: Secondary | ICD-10-CM

## 2019-09-23 DIAGNOSIS — R7881 Bacteremia: Secondary | ICD-10-CM

## 2019-09-23 DIAGNOSIS — U071 COVID-19: Secondary | ICD-10-CM | POA: Diagnosis not present

## 2019-09-23 DIAGNOSIS — Z9911 Dependence on respirator [ventilator] status: Secondary | ICD-10-CM | POA: Diagnosis not present

## 2019-09-23 DIAGNOSIS — J9601 Acute respiratory failure with hypoxia: Secondary | ICD-10-CM | POA: Diagnosis not present

## 2019-09-23 LAB — POCT I-STAT 7, (LYTES, BLD GAS, ICA,H+H)
Acid-Base Excess: 8 mmol/L — ABNORMAL HIGH (ref 0.0–2.0)
Bicarbonate: 35.4 mmol/L — ABNORMAL HIGH (ref 20.0–28.0)
Calcium, Ion: 1.16 mmol/L (ref 1.15–1.40)
HCT: 39 % (ref 39.0–52.0)
Hemoglobin: 13.3 g/dL (ref 13.0–17.0)
O2 Saturation: 93 %
Patient temperature: 99.3
Potassium: 4.8 mmol/L (ref 3.5–5.1)
Sodium: 149 mmol/L — ABNORMAL HIGH (ref 135–145)
TCO2: 37 mmol/L — ABNORMAL HIGH (ref 22–32)
pCO2 arterial: 62.7 mmHg — ABNORMAL HIGH (ref 32.0–48.0)
pH, Arterial: 7.361 (ref 7.350–7.450)
pO2, Arterial: 75 mmHg — ABNORMAL LOW (ref 83.0–108.0)

## 2019-09-23 LAB — BLOOD CULTURE ID PANEL (REFLEXED) - BCID2
A.calcoaceticus-baumannii: NOT DETECTED
A.calcoaceticus-baumannii: NOT DETECTED
Bacteroides fragilis: NOT DETECTED
Bacteroides fragilis: NOT DETECTED
CTX-M ESBL: NOT DETECTED
Candida albicans: NOT DETECTED
Candida albicans: NOT DETECTED
Candida auris: NOT DETECTED
Candida auris: NOT DETECTED
Candida glabrata: NOT DETECTED
Candida glabrata: NOT DETECTED
Candida krusei: NOT DETECTED
Candida krusei: NOT DETECTED
Candida parapsilosis: NOT DETECTED
Candida parapsilosis: NOT DETECTED
Candida tropicalis: NOT DETECTED
Candida tropicalis: NOT DETECTED
Carbapenem resist OXA 48 LIKE: NOT DETECTED
Carbapenem resistance IMP: NOT DETECTED
Carbapenem resistance KPC: NOT DETECTED
Carbapenem resistance NDM: NOT DETECTED
Carbapenem resistance VIM: NOT DETECTED
Cryptococcus neoformans/gattii: NOT DETECTED
Cryptococcus neoformans/gattii: NOT DETECTED
Enterobacter cloacae complex: NOT DETECTED
Enterobacter cloacae complex: NOT DETECTED
Enterobacterales: DETECTED — AB
Enterobacterales: NOT DETECTED
Enterococcus Faecium: NOT DETECTED
Enterococcus Faecium: NOT DETECTED
Enterococcus faecalis: NOT DETECTED
Enterococcus faecalis: NOT DETECTED
Escherichia coli: NOT DETECTED
Escherichia coli: NOT DETECTED
Haemophilus influenzae: NOT DETECTED
Haemophilus influenzae: NOT DETECTED
Klebsiella aerogenes: NOT DETECTED
Klebsiella aerogenes: NOT DETECTED
Klebsiella oxytoca: NOT DETECTED
Klebsiella oxytoca: NOT DETECTED
Klebsiella pneumoniae: NOT DETECTED
Klebsiella pneumoniae: NOT DETECTED
Listeria monocytogenes: NOT DETECTED
Listeria monocytogenes: NOT DETECTED
Methicillin resistance mecA/C: DETECTED — AB
Neisseria meningitidis: NOT DETECTED
Neisseria meningitidis: NOT DETECTED
Proteus species: NOT DETECTED
Proteus species: NOT DETECTED
Pseudomonas aeruginosa: NOT DETECTED
Pseudomonas aeruginosa: NOT DETECTED
Salmonella species: NOT DETECTED
Salmonella species: NOT DETECTED
Serratia marcescens: DETECTED — AB
Serratia marcescens: NOT DETECTED
Staphylococcus aureus (BCID): NOT DETECTED
Staphylococcus aureus (BCID): NOT DETECTED
Staphylococcus epidermidis: DETECTED — AB
Staphylococcus epidermidis: NOT DETECTED
Staphylococcus lugdunensis: NOT DETECTED
Staphylococcus lugdunensis: NOT DETECTED
Staphylococcus species: DETECTED — AB
Staphylococcus species: NOT DETECTED
Stenotrophomonas maltophilia: NOT DETECTED
Stenotrophomonas maltophilia: NOT DETECTED
Streptococcus agalactiae: NOT DETECTED
Streptococcus agalactiae: NOT DETECTED
Streptococcus pneumoniae: NOT DETECTED
Streptococcus pneumoniae: NOT DETECTED
Streptococcus pyogenes: NOT DETECTED
Streptococcus pyogenes: NOT DETECTED
Streptococcus species: NOT DETECTED
Streptococcus species: NOT DETECTED

## 2019-09-23 LAB — TRIGLYCERIDES: Triglycerides: 242 mg/dL — ABNORMAL HIGH (ref <150)

## 2019-09-23 LAB — GLUCOSE, CAPILLARY
Glucose-Capillary: 152 mg/dL — ABNORMAL HIGH (ref 70–99)
Glucose-Capillary: 153 mg/dL — ABNORMAL HIGH (ref 70–99)
Glucose-Capillary: 156 mg/dL — ABNORMAL HIGH (ref 70–99)
Glucose-Capillary: 164 mg/dL — ABNORMAL HIGH (ref 70–99)
Glucose-Capillary: 167 mg/dL — ABNORMAL HIGH (ref 70–99)
Glucose-Capillary: 185 mg/dL — ABNORMAL HIGH (ref 70–99)

## 2019-09-23 MED ORDER — VANCOMYCIN HCL 1250 MG/250ML IV SOLN
1250.0000 mg | INTRAVENOUS | Status: DC
  Administered 2019-09-24 – 2019-09-25 (×2): 1250 mg via INTRAVENOUS
  Filled 2019-09-23 (×3): qty 250

## 2019-09-23 MED ORDER — SODIUM CHLORIDE 0.9 % IV SOLN
2.0000 g | Freq: Once | INTRAVENOUS | Status: AC
  Administered 2019-09-23: 2 g via INTRAVENOUS
  Filled 2019-09-23 (×2): qty 2

## 2019-09-23 MED ORDER — SODIUM CHLORIDE 0.9 % IV SOLN
2.0000 g | Freq: Two times a day (BID) | INTRAVENOUS | Status: AC
  Administered 2019-09-24 – 2019-09-29 (×11): 2 g via INTRAVENOUS
  Filled 2019-09-23 (×13): qty 2

## 2019-09-23 MED ORDER — LACTATED RINGERS IV BOLUS
1000.0000 mL | Freq: Once | INTRAVENOUS | Status: AC
  Administered 2019-09-23: 1000 mL via INTRAVENOUS

## 2019-09-23 MED ORDER — VANCOMYCIN HCL 2000 MG/400ML IV SOLN
2000.0000 mg | Freq: Once | INTRAVENOUS | Status: AC
  Administered 2019-09-23: 2000 mg via INTRAVENOUS
  Filled 2019-09-23: qty 400

## 2019-09-23 MED ORDER — FREE WATER
300.0000 mL | Status: DC
  Administered 2019-09-23 – 2019-09-24 (×5): 300 mL

## 2019-09-23 NOTE — Progress Notes (Signed)
Pharmacy Antibiotic Note  Samuel Peterson is a 54 y.o. male admitted on 08/15/2019 with SOB in setting of Covid-19.  Pharmacy has been consulted for vancomycin and cefepime for MRSE/Serratia bacteremia.  AKI improving - SCr down 2.2, CrCL 46 ml/min, Tmax 101.5, WBC up to 17.1.  Plan: Vanc 2gm IV x 1, then 1250mg  IV Q24H for goal trough 15-20 mcg/mL Cefepime 2gm IV Q12H Monitor renal fxn, clinical progress, vanc trough at Css  Height: 5' 9.5" (176.5 cm) Weight: 101.3 kg (223 lb 5.2 oz) IBW/kg (Calculated) : 71.85  Temp (24hrs), Avg:100.1 F (37.8 C), Min:99.2 F (37.3 C), Max:101.9 F (38.8 C)  Recent Labs  Lab 09/17/19 0500 09/17/19 0500 09/18/19 0820 09/19/19 0356 09/20/19 0559 09/20/19 1500 09/21/19 1238 09/21/19 1340 09/22/19 1202  WBC 20.3*  --  18.0* 14.2*  --  15.5* 17.1*  --   --   CREATININE 1.01   < > 1.20 1.00 1.04  --   --  3.01* 2.20*   < > = values in this interval not displayed.    Estimated Creatinine Clearance: 46 mL/min (A) (by C-G formula based on SCr of 2.2 mg/dL (H)).    No Known Allergies  Azith 8/30>>9/2 Remdesivir 8/31>>9/4 Baricitinib 9/1>> 9/12 off d/t infxn Decadron 8/31 >> 9/9  Cefepime 9/13 >> Vanc 9/13 >>  8/31 BCx - negative 9/6 MRSA PCR - negative 9/12 TA - GNR / GPC / GPR on Gram stain 9/12 BCx - GPC / GNR (BCID MRSE and Serratia)  Anvith Mauriello D. 11/12, PharmD, BCPS, BCCCP 09/23/2019, 11:56 AM

## 2019-09-23 NOTE — Progress Notes (Signed)
NAME:  Samuel Peterson, MRN:  [id number], DOB:  1965-01-14, LOS: 13 ADMISSION DATE:  09/02/2019, CONSULTATION DATE:  09/14/19 REFERRING MD:  Criselda Peaches, CHIEF COMPLAINT:  COVID-19   Brief History   54yo male with PMH of PE on eliquis and recurrent respiratory infections not requiring hospitalization presenting with covid-19 pneumonia, first diagnosed 8/26, admitted with worsening shortness of breath and new oxygen requirement.  Transferred to ICU 9/6 and required intubation 9/7.   Past Medical History  PE OSA on CPAP  Significant Hospital Events   8/31> admitted 9/6> ICU transfer   Consults:    Procedures:  09/17/2019 intubation>> 09/18/2019  arterial line >>  Significant Diagnostic Tests:  8/31 CXR> diffuse bilateral interstitial opacities  9/6 CTA PE >> Poor evaluation for pulmonary embolism. No central or large lobar embolism identified; Emphysema; Mosaic attenuation, favored to be related to patchy basilar predominant ground-glass  09/18/2019 chest x-ray with bilateral airspace disease crepitation 09/20/2019 CXR -bilateral interstitial airspace disease with minimal progression 09/20/2019 KUB-colonic dilatation consistent with ileus.  Micro Data:  8/31 Blood cultures x2 >> neg 9/6 MRSA >> neg 9/12 trach asp >> 9/12 BCx2 >> serratia/ MRSE  Antimicrobials:  vanc 9/13 >> Cefepime 9/13 >>  remdesivir 8/31> 9/4 baricitinib 9/1 >> 9/12 Decadron 9/1 >>9/9  Interim history/subjective:  Febrile 101.9/ ongoing tachycardic +BC w/ MRSE/ Serratia starting abx  Objective   Blood pressure 120/75, pulse (!) 122, temperature 99.9 F (37.7 C), temperature source Axillary, resp. rate (!) 22, height 5' 9.5" (1.765 m), weight 101.3 kg, SpO2 92 %.    Vent Mode: PRVC FiO2 (%):  [55 %] 55 % Set Rate:  [24 bmp] 24 bmp Vt Set:  [430 mL] 430 mL PEEP:  [14 cmH20] 14 cmH20 Plateau Pressure:  [26 cmH20-29 cmH20] 26 cmH20   Intake/Output Summary (Last 24 hours) at 09/23/2019 1259 Last data filed  at 09/23/2019 1100 Gross per 24 hour  Intake 1314.88 ml  Output 1300 ml  Net 14.88 ml   Filed Weights   09/21/19 0500 09/22/19 0500 09/23/19 0500  Weight: 101.6 kg 101.6 kg 101.3 kg   Examination: General:  Critically ill adult male intubated and sedated  HEENT: MM pink/moist, ETT, R cortrak, pupils 3/reactive Neuro: RASS -4/-5, sedated CV: ST, no murmur  PULM:  Synchronous with MV, scant secretions, coarse throughout GI: soft, hyperBS, foley  Extremities: cool /dry w/ some cyanosis, +faint pedal pulses, trace LE edema  Resolved Hospital Problem list     Assessment & Plan:   Acute hypoxic and hypercapneic respiratory failure due to COVID-19 Pneumonia Continue LTVV mechanical ventilation Target TVol 6-8cc/kgIBW Target Plateau Pressure < 30cm H20 Target driving pressure less than 15 cm of water Target PaO2 55-65: titrate PEEP/ FiO2 per protocol Consider proning if PF ratio is less than 1:150 position  Ventilator associated pneumonia prevention protocol PAD protocol with dilaudid gtt with prn versed with bowel protocol (previously intolerant to propofol and precedex 2/2 hypotension and bradycardia)  Daily SAT and SBT as appropriate: not ready given ongoing PEEP/ FiO2 requirements May come off isolation on: 9/18 (tested positive for Covid 8/26)  Serratia/ MRSE bacteremia SIRS Remains hemodynamically stable, goal MAP > 65 Vanc/ cefepime started Follow resp cx Trend WBC/ fever curve Tylenol prn fever/ cooling measures   History of PE Continue lovenox (transitioned from apixaban in case he requires procedures such as tracheostomy)  Ileus with tube feed intolerance  Cortrak is postpyloric; TF at goal today  D/c reglan  Episodes of bradycardia, resolved  off of Precedex Continue telemetry monitoring  Hyperglycemia due to steroids Within goal of BG 140- 180 Continue SSI moderate  AKI-improving.  Likely 2/2 urinary retention (foley replaced 9/11) Continue foley    Continues to have good UOP Trend renal indices/ strict I/O's Renal dose meds/ avoid nephrotoxic meds  Hypernatremia Increase free water flushes 200-> 300 q 4hr  Daily Goals Checklist  Pain/Anxiety/Delirium protocol (if indicated): Continue Dilaudid gtt / prn versed for RASS goal -2/ vent synchrony  VAP protocol (if indicated): yes Blood pressure target: Currently on no vasopressors. DVT prophylaxis: lovenox - full dose GI prophylaxis: Protonix BID Urinary catheter: foley for urinary retention Glucose control: SSI Mobility/therapy needs: Bedrest Code Status: Full Family Communication: pending Disposition: ICU  Labs   CBC: Recent Labs  Lab 09/17/19 0500 09/17/19 2030 09/18/19 0820 09/18/19 0820 09/19/19 0356 09/20/19 0319 09/20/19 1500 09/21/19 0243 09/21/19 1238 09/22/19 0512 09/23/19 0436  WBC 20.3*  --  18.0*  --  14.2*  --  15.5*  --  17.1*  --   --   NEUTROABS 18.3*  --  16.0*  --  13.4*  --   --   --   --   --   --   HGB 16.1   < > 15.0   < > 14.0   < > 14.6 15.0 14.9 14.3 13.3  HCT 47.8   < > 44.4   < > 41.5   < > 44.3 44.0 46.7 42.0 39.0  MCV 93.7  --  96.9  --  96.7  --  99.1  --  102.0*  --   --   PLT 122*  --  105*  --  104*  --  146*  --  196  --   --    < > = values in this interval not displayed.    Basic Metabolic Panel: Recent Labs  Lab 09/17/19 1952 09/17/19 2030 09/18/19 0820 09/18/19 0820 09/19/19 0356 09/20/19 0319 09/20/19 0559 09/20/19 0559 09/21/19 0243 09/21/19 1340 09/22/19 0512 09/22/19 1202 09/23/19 0436  NA  --    < > 134*   < > 137   < > 140   < > 139 139 145 148* 149*  K  --    < > 5.0   < > 5.1   < > 4.7   < > 4.7 5.2* 4.7 4.7 4.8  CL  --   --  96*  --  100  --  100  --   --  98  --  107  --   CO2  --   --  26  --  27  --  31  --   --  27  --  30  --   GLUCOSE  --   --  142*  --  202*  --  121*  --   --  175*  --  169*  --   BUN  --   --  32*  --  35*  --  42*  --   --  90*  --  92*  --   CREATININE  --   --  1.20  --   1.00  --  1.04  --   --  3.01*  --  2.20*  --   CALCIUM  --   --  8.5*  --  8.3*  --  8.6*  --   --  8.2*  --  8.4*  --   MG 2.3  --  2.8*  --  2.5*  --   --   --   --  3.4*  --   --   --   PHOS 6.5*  --  6.5*  --  3.8  --   --   --   --   --   --   --   --    < > = values in this interval not displayed.   GFR: Estimated Creatinine Clearance: 46 mL/min (A) (by C-G formula based on SCr of 2.2 mg/dL (H)). Recent Labs  Lab 09/18/19 0820 09/19/19 0356 09/20/19 1500 09/21/19 1238  WBC 18.0* 14.2* 15.5* 17.1*    Liver Function Tests: Recent Labs  Lab 09/17/19 0500 09/18/19 0820 09/19/19 0356 09/20/19 0559  AST 44* 25 19 31   ALT 56* 48* 38 53*  ALKPHOS 97 87 73 79  BILITOT 2.8* 1.9* 1.4* 2.3*  PROT 6.9 6.9 6.7 6.5  ALBUMIN 3.2* 2.8* 2.6* 2.5*   No results for input(s): LIPASE, AMYLASE in the last 168 hours. No results for input(s): AMMONIA in the last 168 hours.  ABG    Component Value Date/Time   PHART 7.361 09/23/2019 0436   PCO2ART 62.7 (H) 09/23/2019 0436   PO2ART 75 (L) 09/23/2019 0436   HCO3 35.4 (H) 09/23/2019 0436   TCO2 37 (H) 09/23/2019 0436   ACIDBASEDEF 2.0 09/06/2019 1059   O2SAT 93.0 09/23/2019 0436     Coagulation Profile: No results for input(s): INR, PROTIME in the last 168 hours.  Cardiac Enzymes: No results for input(s): CKTOTAL, CKMB, CKMBINDEX, TROPONINI in the last 168 hours.  HbA1C: Hgb A1c MFr Bld  Date/Time Value Ref Range Status  09/17/2019 08:18 AM 6.0 (H) 4.8 - 5.6 % Final    Comment:    (NOTE) Pre diabetes:          5.7%-6.4%  Diabetes:              >6.4%  Glycemic control for   <7.0% adults with diabetes     CBG: Recent Labs  Lab 09/22/19 1951 09/22/19 2351 09/23/19 0306 09/23/19 0801 09/23/19 1154  GLUCAP 165* 190* 153* 156* 152*     This patient is critically ill with multiple organ system failure which requires frequent high complexity decision making, assessment, support, evaluation, and titration of therapies.  This was completed through the application of advanced monitoring technologies and extensive interpretation of multiple databases. During this encounter critical care time was devoted to patient care services described in this note for 35 minutes.   09/25/19, MSN, AGACNP-BC [facility] 09/23/2019, 1:00 PM  See Amion for personal pager PCCM on call pager [phone]

## 2019-09-23 NOTE — Progress Notes (Signed)
PHARMACY - PHYSICIAN COMMUNICATION CRITICAL VALUE ALERT - BLOOD CULTURE IDENTIFICATION (BCID)  Samuel Peterson is an 53 y.o. male who presented to [facility] on 09/09/2019 with a chief complaint of respiratory failure secondary to COVID19 pneumonia.   Assessment: Bcx growing GPC and GNR in 1 set - BCID identified as MRSE and serratia marcescens  Name of physician (or Provider) Contacted: Dr. Chestine Spore will be alerted by pharmacist on floor [facility] [place])  Current antibiotics: none  Changes to prescribed antibiotics recommended: Start vancomycin and cefepime Recommendations accepted by provider   Results for orders placed or performed during the hospital encounter of 08/12/2019  Blood Culture ID Panel (Reflexed) (Collected: 09/22/2019 12:40 PM)  Result Value Ref Range   Enterococcus faecalis NOT DETECTED NOT DETECTED   Enterococcus Faecium NOT DETECTED NOT DETECTED   Listeria monocytogenes NOT DETECTED NOT DETECTED   Staphylococcus species NOT DETECTED NOT DETECTED   Staphylococcus aureus (BCID) NOT DETECTED NOT DETECTED   Staphylococcus epidermidis NOT DETECTED NOT DETECTED   Staphylococcus lugdunensis NOT DETECTED NOT DETECTED   Streptococcus species NOT DETECTED NOT DETECTED   Streptococcus agalactiae NOT DETECTED NOT DETECTED   Streptococcus pneumoniae NOT DETECTED NOT DETECTED   Streptococcus pyogenes NOT DETECTED NOT DETECTED   A.calcoaceticus-baumannii NOT DETECTED NOT DETECTED   Bacteroides fragilis NOT DETECTED NOT DETECTED   Enterobacterales DETECTED (A) NOT DETECTED   Enterobacter cloacae complex NOT DETECTED NOT DETECTED   Escherichia coli NOT DETECTED NOT DETECTED   Klebsiella aerogenes NOT DETECTED NOT DETECTED   Klebsiella oxytoca NOT DETECTED NOT DETECTED   Klebsiella pneumoniae NOT DETECTED NOT DETECTED   Proteus species NOT DETECTED NOT DETECTED   Salmonella species NOT DETECTED NOT DETECTED   Serratia marcescens DETECTED (A) NOT DETECTED   Haemophilus influenzae NOT  DETECTED NOT DETECTED   Neisseria meningitidis NOT DETECTED NOT DETECTED   Pseudomonas aeruginosa NOT DETECTED NOT DETECTED   Stenotrophomonas maltophilia NOT DETECTED NOT DETECTED   Candida albicans NOT DETECTED NOT DETECTED   Candida auris NOT DETECTED NOT DETECTED   Candida glabrata NOT DETECTED NOT DETECTED   Candida krusei NOT DETECTED NOT DETECTED   Candida parapsilosis NOT DETECTED NOT DETECTED   Candida tropicalis NOT DETECTED NOT DETECTED   Cryptococcus neoformans/gattii NOT DETECTED NOT DETECTED   CTX-M ESBL NOT DETECTED NOT DETECTED   Carbapenem resistance IMP NOT DETECTED NOT DETECTED   Carbapenem resistance KPC NOT DETECTED NOT DETECTED   Carbapenem resistance NDM NOT DETECTED NOT DETECTED   Carbapenem resist OXA 48 LIKE NOT DETECTED NOT DETECTED   Carbapenem resistance VIM NOT DETECTED NOT DETECTED   Kinnie Feil, PharmD PGY1 Acute Care Pharmacy Resident Phone: [phone]. 09/23/2019 10:06 AM  Please check [email or web address] for unit specific pharmacy phone numbers.

## 2019-09-24 ENCOUNTER — Inpatient Hospital Stay (HOSPITAL_COMMUNITY): Payer: Federal, State, Local not specified - PPO

## 2019-09-24 DIAGNOSIS — U071 COVID-19: Secondary | ICD-10-CM | POA: Diagnosis not present

## 2019-09-24 DIAGNOSIS — A498 Other bacterial infections of unspecified site: Secondary | ICD-10-CM | POA: Diagnosis not present

## 2019-09-24 DIAGNOSIS — R7881 Bacteremia: Secondary | ICD-10-CM | POA: Diagnosis not present

## 2019-09-24 DIAGNOSIS — J9690 Respiratory failure, unspecified, unspecified whether with hypoxia or hypercapnia: Secondary | ICD-10-CM | POA: Diagnosis not present

## 2019-09-24 DIAGNOSIS — J95851 Ventilator associated pneumonia: Secondary | ICD-10-CM

## 2019-09-24 DIAGNOSIS — B9561 Methicillin susceptible Staphylococcus aureus infection as the cause of diseases classified elsewhere: Secondary | ICD-10-CM

## 2019-09-24 DIAGNOSIS — Z9911 Dependence on respirator [ventilator] status: Secondary | ICD-10-CM | POA: Diagnosis not present

## 2019-09-24 LAB — CBC
HCT: 43.1 % (ref 39.0–52.0)
Hemoglobin: 13 g/dL (ref 13.0–17.0)
MCH: 32.5 pg (ref 26.0–34.0)
MCHC: 30.2 g/dL (ref 30.0–36.0)
MCV: 107.8 fL — ABNORMAL HIGH (ref 80.0–100.0)
Platelets: 227 10*3/uL (ref 150–400)
RBC: 4 MIL/uL — ABNORMAL LOW (ref 4.22–5.81)
RDW: 12.1 % (ref 11.5–15.5)
WBC: 12.3 10*3/uL — ABNORMAL HIGH (ref 4.0–10.5)
nRBC: 0.2 % (ref 0.0–0.2)

## 2019-09-24 LAB — POCT I-STAT 7, (LYTES, BLD GAS, ICA,H+H)
Acid-Base Excess: 6 mmol/L — ABNORMAL HIGH (ref 0.0–2.0)
Bicarbonate: 33.3 mmol/L — ABNORMAL HIGH (ref 20.0–28.0)
Calcium, Ion: 1.16 mmol/L (ref 1.15–1.40)
HCT: 37 % — ABNORMAL LOW (ref 39.0–52.0)
Hemoglobin: 12.6 g/dL — ABNORMAL LOW (ref 13.0–17.0)
O2 Saturation: 88 %
Patient temperature: 101.8
Potassium: 5.3 mmol/L — ABNORMAL HIGH (ref 3.5–5.1)
Sodium: 150 mmol/L — ABNORMAL HIGH (ref 135–145)
TCO2: 35 mmol/L — ABNORMAL HIGH (ref 22–32)
pCO2 arterial: 66.8 mmHg (ref 32.0–48.0)
pH, Arterial: 7.314 — ABNORMAL LOW (ref 7.350–7.450)
pO2, Arterial: 67 mmHg — ABNORMAL LOW (ref 83.0–108.0)

## 2019-09-24 LAB — ECHOCARDIOGRAM LIMITED
Area-P 1/2: 3.72 cm2
Height: 69.5 in
S' Lateral: 2.2 cm
Weight: 3555.58 oz

## 2019-09-24 LAB — GLUCOSE, CAPILLARY
Glucose-Capillary: 148 mg/dL — ABNORMAL HIGH (ref 70–99)
Glucose-Capillary: 160 mg/dL — ABNORMAL HIGH (ref 70–99)
Glucose-Capillary: 174 mg/dL — ABNORMAL HIGH (ref 70–99)
Glucose-Capillary: 185 mg/dL — ABNORMAL HIGH (ref 70–99)
Glucose-Capillary: 193 mg/dL — ABNORMAL HIGH (ref 70–99)
Glucose-Capillary: 196 mg/dL — ABNORMAL HIGH (ref 70–99)
Glucose-Capillary: 213 mg/dL — ABNORMAL HIGH (ref 70–99)

## 2019-09-24 LAB — RENAL FUNCTION PANEL
Albumin: 1.9 g/dL — ABNORMAL LOW (ref 3.5–5.0)
Anion gap: 7 (ref 5–15)
BUN: 74 mg/dL — ABNORMAL HIGH (ref 6–20)
CO2: 31 mmol/L (ref 22–32)
Calcium: 8.1 mg/dL — ABNORMAL LOW (ref 8.9–10.3)
Chloride: 111 mmol/L (ref 98–111)
Creatinine, Ser: 1.92 mg/dL — ABNORMAL HIGH (ref 0.61–1.24)
GFR calc Af Amer: 45 mL/min — ABNORMAL LOW (ref >60)
GFR calc non Af Amer: 39 mL/min — ABNORMAL LOW (ref >60)
Glucose, Bld: 223 mg/dL — ABNORMAL HIGH (ref 70–99)
Phosphorus: 2.9 mg/dL (ref 2.5–4.6)
Potassium: 5.3 mmol/L — ABNORMAL HIGH (ref 3.5–5.1)
Sodium: 149 mmol/L — ABNORMAL HIGH (ref 135–145)

## 2019-09-24 LAB — MAGNESIUM: Magnesium: 3.1 mg/dL — ABNORMAL HIGH (ref 1.7–2.4)

## 2019-09-24 MED ORDER — FUROSEMIDE 10 MG/ML IJ SOLN
60.0000 mg | Freq: Two times a day (BID) | INTRAMUSCULAR | Status: AC
  Administered 2019-09-24 – 2019-09-25 (×2): 60 mg via INTRAVENOUS
  Filled 2019-09-24 (×2): qty 6

## 2019-09-24 MED ORDER — SODIUM ZIRCONIUM CYCLOSILICATE 5 G PO PACK
10.0000 g | PACK | Freq: Every day | ORAL | Status: DC
  Administered 2019-09-24 – 2019-09-29 (×7): 10 g
  Filled 2019-09-24 (×2): qty 2
  Filled 2019-09-24 (×3): qty 1
  Filled 2019-09-24: qty 2

## 2019-09-24 MED ORDER — INSULIN GLARGINE 100 UNIT/ML ~~LOC~~ SOLN
10.0000 [IU] | Freq: Every day | SUBCUTANEOUS | Status: DC
  Administered 2019-09-24 – 2019-09-29 (×5): 10 [IU] via SUBCUTANEOUS
  Filled 2019-09-24 (×7): qty 0.1

## 2019-09-24 MED ORDER — FREE WATER
200.0000 mL | Status: DC
  Administered 2019-09-24 – 2019-09-25 (×8): 200 mL

## 2019-09-24 NOTE — Consult Note (Signed)
[facility]ious [facility]    Date of Admission:  09/06/2019           Day 2 vancomycin        Day 2 cefepime       Reason for Consult: Automatic consultation for staph aureus bacteremia     Assessment: I am in full agreement with Dr. Ainsley Spinner assessment and plans.  I recommend continuing current antibiotic therapy pending final cultures and TTE results.  Given that his infection was caught very quickly and treated appropriately I think the chance of endocarditis is extremely low.  If TTE is negative for endocarditis I would not pursue TEE.  He has an arterial line in currently.  I am in agreement with keeping it in unless he has trouble clearing his bacteremias.  Plan: 1. Continue current antibiotics pending final culture and TTE results 2. I will follow with you  Principal Problem:   Ventilator associated pneumonia ([place]) Active Problems:   Staphylococcus aureus bacteremia   Serratia infection   COVID-19   Acute respiratory failure with hypoxia (HCC)   Scheduled Meds: . bethanechol  10 mg Per Tube TID  . chlorhexidine gluconate (MEDLINE KIT)  15 mL Mouth Rinse BID  . Chlorhexidine Gluconate Cloth  6 each Topical Daily  . docusate  100 mg Per Tube BID  . enoxaparin (LOVENOX) injection  100 mg Subcutaneous Q12H  . feeding supplement (PROSource TF)  45 mL Per Tube TID  . free water  200 mL Per Tube Q2H  . furosemide  60 mg Intravenous Q12H  . insulin aspart  0-15 Units Subcutaneous Q4H  . insulin glargine  10 Units Subcutaneous Daily  . latanoprost  1 drop Both Eyes QHS  . mouth rinse  15 mL Mouth Rinse 10 times per day  . melatonin  3 mg Per Tube QHS  . pantoprazole sodium  40 mg Per Tube BID  . polyethylene glycol  17 g Per Tube Daily  . senna-docusate  1 tablet Per Tube Daily  . sodium zirconium cyclosilicate  10 g Per Tube Daily   Continuous Infusions: . sodium chloride Stopped (09/24/19 1245)  . ceFEPime (MAXIPIME) IV 200 mL/hr at 09/24/19 1300    . feeding supplement (VITAL 1.5 CAL) 60 mL/hr at 09/24/19 0800  . HYDROmorphone 4 mg/hr (09/24/19 1300)  . vancomycin     PRN Meds:.Place/Maintain arterial line **AND** sodium chloride, acetaminophen, albuterol, guaiFENesin-dextromethorphan, HYDROmorphone, HYDROmorphone (DILAUDID) injection, iohexol, lip balm, metoprolol tartrate, midazolam, midazolam, midazolam, polyethylene glycol, sodium chloride  HPI: Samuel Peterson is a 54 y.o. male who was admitted on 09/06/2019 with severe Covid requiring intubation.  He was treated with remdesivir, dexamethasone and baracitinib with improvement.  He began having new onset fevers on 09/21/2019 followed by worsening infiltrates.  Blood cultures were obtained.  1 set is growing methicillin-resistant staph epidermidis.  The second set is growing staph aureus that was not detected by a BC ID so no data about methicillin resistance is currently available.  Serratia was detected on BC ID but so far is not growing.  He has been started on appropriate therapy with vancomycin and cefepime.  Repeat blood cultures and TEE are pending.   Review of Systems: Review of Systems  Unable to perform ROS: Other  Constitutional:       This is a remote consultation so no review of systems was obtained.    Past Medical History:  Diagnosis Date  . Pulmonary  emboli ([place]) 2018  . Sleep apnea    Uses CPAP at night    Social History   Tobacco Use  . Smoking status: Former Research scientist (life sciences)  . Smokeless tobacco: Never Used  Substance Use Topics  . Alcohol use: Not on file  . Drug use: Not on file    History reviewed. No pertinent family history. No Known Allergies  OBJECTIVE: Blood pressure 113/69, pulse (!) 118, temperature 99.4 F (37.4 C), temperature source Axillary, resp. rate (!) 28, height 5' 9.5" (1.765 m), weight 100.8 kg, SpO2 92 %.  Physical Exam Constitutional:      Comments: This is a remote consultation so no physical exam was performed.     Lab  Results Lab Results  Component Value Date   WBC 12.3 (H) 09/24/2019   HGB 13.0 09/24/2019   HCT 43.1 09/24/2019   MCV 107.8 (H) 09/24/2019   PLT 227 09/24/2019    Lab Results  Component Value Date   CREATININE 1.92 (H) 09/24/2019   BUN 74 (H) 09/24/2019   NA 149 (H) 09/24/2019   K 5.3 (H) 09/24/2019   CL 111 09/24/2019   CO2 31 09/24/2019    Lab Results  Component Value Date   ALT 53 (H) 09/20/2019   AST 31 09/20/2019   ALKPHOS 79 09/20/2019   BILITOT 2.3 (H) 09/20/2019     Microbiology: Recent Results (from the past 240 hour(s))  MRSA PCR Screening     Status: None   Collection Time: 09/16/19  1:58 PM   Specimen: Nasal Mucosa; Nasopharyngeal  Result Value Ref Range Status   MRSA by PCR NEGATIVE NEGATIVE Final    Comment:        The GeneXpert MRSA Assay (FDA approved for NASAL specimens only), is one component of a comprehensive MRSA colonization surveillance program. It is not intended to diagnose MRSA infection nor to guide or monitor treatment for MRSA infections. Performed at [facility], [place] [address], [place], [place] [address]   Culture, respiratory (non-expectorated)     Status: None (Preliminary result)   Collection Time: 09/22/19 12:55 AM   Specimen: Tracheal Aspirate; Respiratory  Result Value Ref Range Status   Specimen Description TRACHEAL ASPIRATE  Final   Special Requests NONE  Final   Gram Stain   Final    MODERATE WBC PRESENT, PREDOMINANTLY PMN ABUNDANT GRAM NEGATIVE RODS ABUNDANT GRAM POSITIVE COCCI IN PAIRS MODERATE GRAM POSITIVE RODS    Culture   Final    ABUNDANT STAPHYLOCOCCUS AUREUS MODERATE GRAM NEGATIVE RODS SUSCEPTIBILITIES TO FOLLOW Performed at [facility], [place] [address], [place], [place] [address]    Report Status PENDING  Incomplete  Culture, blood (Routine X 2) w Reflex to ID Panel     Status: Abnormal (Preliminary result)   Collection Time: 09/22/19 12:40 PM   Specimen: BLOOD  Result Value Ref Range  Status   Specimen Description BLOOD RIGHT ANTECUBITAL  Final   Special Requests   Final    BOTTLES DRAWN AEROBIC AND ANAEROBIC Blood Culture results may not be optimal due to an inadequate volume of blood received in culture bottles   Culture  Setup Time   Final    GRAM POSITIVE COCCI IN CLUSTERS ANAEROBIC BOTTLE ONLY Organism ID to follow GRAM NEGATIVE RODS AEROBIC BOTTLE ONLY CRITICAL RESULT CALLED TO, READ BACK BY AND VERIFIED WITH: E. SULLIVAN PHARMD, AT [place] 09/23/19 BY D. VANHOOK    Culture (A)  Final    STAPHYLOCOCCUS EPIDERMIDIS SERRATIA  MARCESCENS SUSCEPTIBILITIES TO FOLLOW Performed at [facility], [place] [address] [place], [place] [address]    Report Status PENDING  Incomplete  Blood Culture ID Panel (Reflexed)     Status: Abnormal   Collection Time: 09/22/19 12:40 PM  Result Value Ref Range Status   Enterococcus faecalis NOT DETECTED NOT DETECTED Final   Enterococcus Faecium NOT DETECTED NOT DETECTED Final   Listeria monocytogenes NOT DETECTED NOT DETECTED Final   Staphylococcus species DETECTED (A) NOT DETECTED Final    Comment: CRITICAL RESULT CALLED TO, READ BACK BY AND VERIFIED WITH: E. SULLIVAN PHARMD, AT 0935 09/23/19 BY D. VANHOOK    Staphylococcus aureus (BCID) NOT DETECTED NOT DETECTED Final   Staphylococcus epidermidis DETECTED (A) NOT DETECTED Final    Comment: Methicillin (oxacillin) resistant coagulase negative staphylococcus. Possible blood culture contaminant (unless isolated from more than one blood culture draw or clinical case suggests pathogenicity). No antibiotic treatment is indicated for blood  culture contaminants.    Staphylococcus lugdunensis NOT DETECTED NOT DETECTED Final   Streptococcus species NOT DETECTED NOT DETECTED Final   Streptococcus agalactiae NOT DETECTED NOT DETECTED Final   Streptococcus pneumoniae NOT DETECTED NOT DETECTED Final   Streptococcus pyogenes NOT DETECTED NOT DETECTED Final   A.calcoaceticus-baumannii NOT  DETECTED NOT DETECTED Final   Bacteroides fragilis NOT DETECTED NOT DETECTED Final   Enterobacterales NOT DETECTED NOT DETECTED Final   Enterobacter cloacae complex NOT DETECTED NOT DETECTED Final   Escherichia coli NOT DETECTED NOT DETECTED Final   Klebsiella aerogenes NOT DETECTED NOT DETECTED Final   Klebsiella oxytoca NOT DETECTED NOT DETECTED Final   Klebsiella pneumoniae NOT DETECTED NOT DETECTED Final   Proteus species NOT DETECTED NOT DETECTED Final   Salmonella species NOT DETECTED NOT DETECTED Final   Serratia marcescens NOT DETECTED NOT DETECTED Final   Haemophilus influenzae NOT DETECTED NOT DETECTED Final   Neisseria meningitidis NOT DETECTED NOT DETECTED Final   Pseudomonas aeruginosa NOT DETECTED NOT DETECTED Final   Stenotrophomonas maltophilia NOT DETECTED NOT DETECTED Final   Candida albicans NOT DETECTED NOT DETECTED Final   Candida auris NOT DETECTED NOT DETECTED Final   Candida glabrata NOT DETECTED NOT DETECTED Final   Candida krusei NOT DETECTED NOT DETECTED Final   Candida parapsilosis NOT DETECTED NOT DETECTED Final   Candida tropicalis NOT DETECTED NOT DETECTED Final   Cryptococcus neoformans/gattii NOT DETECTED NOT DETECTED Final   Methicillin resistance mecA/C DETECTED (A) NOT DETECTED Final    Comment: CRITICAL RESULT CALLED TO, READ BACK BY AND VERIFIED WITHJoneen Caraway PHARMD, AT 0935 09/23/19 BY Rush Landmark Performed at [facility], [address] [place], [place] [address]   Blood Culture ID Panel (Reflexed)     Status: Abnormal   Collection Time: 09/22/19 12:40 PM  Result Value Ref Range Status   Enterococcus faecalis NOT DETECTED NOT DETECTED Final   Enterococcus Faecium NOT DETECTED NOT DETECTED Final   Listeria monocytogenes NOT DETECTED NOT DETECTED Final   Staphylococcus species NOT DETECTED NOT DETECTED Final   Staphylococcus aureus (BCID) NOT DETECTED NOT DETECTED Final   Staphylococcus epidermidis NOT DETECTED NOT DETECTED Final    Staphylococcus lugdunensis NOT DETECTED NOT DETECTED Final   Streptococcus species NOT DETECTED NOT DETECTED Final   Streptococcus agalactiae NOT DETECTED NOT DETECTED Final   Streptococcus pneumoniae NOT DETECTED NOT DETECTED Final   Streptococcus pyogenes NOT DETECTED NOT DETECTED Final   A.calcoaceticus-baumannii NOT DETECTED NOT DETECTED Final   Bacteroides fragilis NOT DETECTED NOT DETECTED  Final   Enterobacterales DETECTED (A) NOT DETECTED Final    Comment: Enterobacterales represent a large order of gram negative bacteria, not a single organism. CRITICAL RESULT CALLED TO, READ BACK BY AND VERIFIED WITH: E. SULLIVAN PHARMD, AT 0935 09/23/19 BY D. VANHOOK    Enterobacter cloacae complex NOT DETECTED NOT DETECTED Final   Escherichia coli NOT DETECTED NOT DETECTED Final   Klebsiella aerogenes NOT DETECTED NOT DETECTED Final   Klebsiella oxytoca NOT DETECTED NOT DETECTED Final   Klebsiella pneumoniae NOT DETECTED NOT DETECTED Final   Proteus species NOT DETECTED NOT DETECTED Final   Salmonella species NOT DETECTED NOT DETECTED Final   Serratia marcescens DETECTED (A) NOT DETECTED Final    Comment: CRITICAL RESULT CALLED TO, READ BACK BY AND VERIFIED WITH: E. SULLIVAN PHARMD, AT 0935 09/23/19 BY D. VANHOOK    Haemophilus influenzae NOT DETECTED NOT DETECTED Final   Neisseria meningitidis NOT DETECTED NOT DETECTED Final   Pseudomonas aeruginosa NOT DETECTED NOT DETECTED Final   Stenotrophomonas maltophilia NOT DETECTED NOT DETECTED Final   Candida albicans NOT DETECTED NOT DETECTED Final   Candida auris NOT DETECTED NOT DETECTED Final   Candida glabrata NOT DETECTED NOT DETECTED Final   Candida krusei NOT DETECTED NOT DETECTED Final   Candida parapsilosis NOT DETECTED NOT DETECTED Final   Candida tropicalis NOT DETECTED NOT DETECTED Final   Cryptococcus neoformans/gattii NOT DETECTED NOT DETECTED Final   CTX-M ESBL NOT DETECTED NOT DETECTED Final   Carbapenem resistance IMP NOT  DETECTED NOT DETECTED Final   Carbapenem resistance KPC NOT DETECTED NOT DETECTED Final   Carbapenem resistance NDM NOT DETECTED NOT DETECTED Final   Carbapenem resist OXA 48 LIKE NOT DETECTED NOT DETECTED Final   Carbapenem resistance VIM NOT DETECTED NOT DETECTED Final    Comment: Performed at [facility], [address] [place], [place] [address]  Culture, blood (Routine X 2) w Reflex to ID Panel     Status: Abnormal (Preliminary result)   Collection Time: 09/22/19 12:50 PM   Specimen: BLOOD RIGHT HAND  Result Value Ref Range Status   Specimen Description BLOOD RIGHT HAND  Final   Special Requests   Final    BOTTLES DRAWN AEROBIC ONLY Blood Culture adequate volume   Culture  Setup Time   Final    GRAM POSITIVE COCCI IN CLUSTERS AEROBIC BOTTLE ONLY CRITICAL VALUE NOTED.  VALUE IS CONSISTENT WITH PREVIOUSLY REPORTED AND CALLED VALUE.    Culture (A)  Final    STAPHYLOCOCCUS AUREUS SUSCEPTIBILITIES TO FOLLOW Performed at [facility], [place] [address], [place], [place] [address]    Report Status PENDING  Incomplete    Michel Bickers, MD [facility] [place] [facility] [phone] pager   [phone] cell 09/24/2019, 2:35 PM

## 2019-09-24 NOTE — Progress Notes (Signed)
NAME:  Samuel Peterson, MRN:  [id number], DOB:  [birth year], LOS: 14 ADMISSION DATE:  09-23-19, CONSULTATION DATE:  09/14/19 REFERRING MD:  Criselda Peaches, CHIEF COMPLAINT:  COVID-19   Brief History   54yo male with PMH of PE on eliquis and recurrent respiratory infections not requiring hospitalization presenting with covid-19 pneumonia, first diagnosed 8/26, admitted with worsening shortness of breath and new oxygen requirement.  Transferred to ICU 9/6 and required intubation 9/7.   Past Medical History  PE OSA on CPAP  Significant Hospital Events   8/31> admitted 9/6> ICU transfer   Consults:    Procedures:  09/17/2019 intubation>> 09/18/2019  arterial line >>  Significant Diagnostic Tests:  8/31 CXR> diffuse bilateral interstitial opacities  9/6 CTA PE >> Poor evaluation for pulmonary embolism. No central or large lobar embolism identified; Emphysema; Mosaic attenuation, favored to be related to patchy basilar predominant ground-glass  09/18/2019 chest x-ray with bilateral airspace disease crepitation 09/20/2019 CXR -bilateral interstitial airspace disease with minimal progression 09/20/2019 KUB-colonic dilatation consistent with ileus. 9/14 echocardiogram:  Micro Data:  8/31 Blood cultures x2 >> neg 9/6 MRSA >> neg 9/12 trach asp >> GPC, GNR> 9/12 BCx2 >> serratia, MRSE, staph aureus (on biofire & cx) 9/14 blood cx>   Antimicrobials:  vanc 9/13 >> Cefepime 9/13 >>  remdesivir 8/31> 9/4 baricitinib 9/1 >> 9/12 Decadron 9/1 >>9/9  Interim history/subjective:  Remains febrile.    Objective   Blood pressure 111/74, pulse (!) 118, temperature (!) 101.1 F (38.4 C), temperature source Axillary, resp. rate 18, height 5' 9.5" (1.765 m), weight 100.8 kg, SpO2 92 %.    Vent Mode: PRVC FiO2 (%):  [50 %] 50 % Set Rate:  [24 bmp] 24 bmp Vt Set:  [430 mL-4230 mL] 4230 mL PEEP:  [14 cmH20] 14 cmH20 Plateau Pressure:  [13 cmH20-28 cmH20] 28 cmH20   Intake/Output Summary (Last 24  hours) at 09/24/2019 1220 Last data filed at 09/24/2019 1000 Gross per 24 hour  Intake 3599.47 ml  Output 1875 ml  Net 1724.47 ml   Filed Weights   09/22/19 0500 09/23/19 0500 09/24/19 0500  Weight: 101.6 kg 101.3 kg 100.8 kg   Examination: General:  Critically ill appearing man laying in bed in NAD HEENT: [place]/AT, eyes anicteric. ETT in place. Neuro: RASS -5, vigorous cough reflex, pinpoint pupils. CV: tachycardic, reg rhythm.  PULM:  tachypneic but synchronous with MV. Rhales bilaterally. Copious tan secretions from ETT. GI: soft, NT, ND Extremities: pedal edema Derm: diaphoretic, no rashes  Resolved Hospital Problem list     Assessment & Plan:    Acute hypoxic and hypercapneic respiratory failure due to COVID-19 Pneumonia -intubated 09/17/19 -Continue LTVV mechanical ventilation, permissive hypercapnia, sedation per PAD protocol to tolerate mechanical ventilation (previously intolerant Precedex and propofol due to bradycardia and hypotension.) -VAP prevention protocol -Daily SAT and SBT as appropriate: not ready given ongoing PEEP/ FiO2 requirements -May come off isolation on: 9/18 (tested positive for Covid 8/26)  Serratia & MRSE bacteremia Serratia and staph aureus pneumonia -Remains hemodynamically stable, goal MAP > 65 -Vanc/ cefepime  -Echocardiogram  -Continue to follow cultures until sensitivities finalized. -Tylenol prn fever -Repeat blood cultures today.  Discussed case with ID.  History of PE -Continue lovenox (transitioned from apixaban in case he requires procedures such as tracheostomy)  Ileus with tube feed intolerance  -Cortrak is postpyloric; TF at goal today  -D/c reglan unless recurrent vomiting  Episodes of bradycardia, resolved off of Precedex -Continue telemetry monitoring  Hyperglycemia due to  steroids -Within goal of BG 140- 180 -Adding Lantus 10 units daily plus sliding scale insulin as needed  AKI-improving.  Likely 2/2 urinary retention  (foley replaced 9/11) -Continue foley  -Strict I's/O -Renally dose meds and avoid nephrotoxic meds  Hypernatremia -Increase free water flushes  Daily Goals Checklist  Pain/Anxiety/Delirium protocol (if indicated): Continue Dilaudid gtt / prn versed for RASS goal -2/ vent synchrony  VAP protocol (if indicated): yes Blood pressure target: Currently on no vasopressors. DVT prophylaxis: lovenox - full dose GI prophylaxis: Protonix BID Urinary catheter: foley for urinary retention Glucose control: SSI Mobility/therapy needs: Bedrest Code Status: Full Family Communication: Updated wife Jasmine December. Disposition: ICU  Labs   CBC: Recent Labs  Lab 09/18/19 0820 09/18/19 0820 09/19/19 0356 09/20/19 0319 09/20/19 1500 09/21/19 0243 09/21/19 1238 09/22/19 0512 09/23/19 0436 09/24/19 0450 09/24/19 0500  WBC 18.0*  --  14.2*  --  15.5*  --  17.1*  --   --   --  12.3*  NEUTROABS 16.0*  --  13.4*  --   --   --   --   --   --   --   --   HGB 15.0   < > 14.0   < > 14.6   < > 14.9 14.3 13.3 12.6* 13.0  HCT 44.4   < > 41.5   < > 44.3   < > 46.7 42.0 39.0 37.0* 43.1  MCV 96.9  --  96.7  --  99.1  --  102.0*  --   --   --  107.8*  PLT 105*  --  104*  --  146*  --  196  --   --   --  227   < > = values in this interval not displayed.    Basic Metabolic Panel: Recent Labs  Lab 09/17/19 1952 09/17/19 2030 09/18/19 0820 09/18/19 0820 09/19/19 0356 09/20/19 0319 09/20/19 0559 09/21/19 0243 09/21/19 1340 09/21/19 1340 09/22/19 0512 09/22/19 1202 09/23/19 0436 09/24/19 0450 09/24/19 0500  NA  --    < > 134*   < > 137   < > 140   < > 139   < > 145 148* 149* 150* 149*  K  --    < > 5.0   < > 5.1   < > 4.7   < > 5.2*   < > 4.7 4.7 4.8 5.3* 5.3*  CL  --   --  96*   < > 100  --  100  --  98  --   --  107  --   --  111  CO2  --   --  26   < > 27  --  31  --  27  --   --  30  --   --  31  GLUCOSE  --   --  142*   < > 202*  --  121*  --  175*  --   --  169*  --   --  223*  BUN  --   --  32*    < > 35*  --  42*  --  90*  --   --  92*  --   --  74*  CREATININE  --   --  1.20   < > 1.00  --  1.04  --  3.01*  --   --  2.20*  --   --  1.92*  CALCIUM  --   --  8.5*   < > 8.3*  --  8.6*  --  8.2*  --   --  8.4*  --   --  8.1*  MG 2.3  --  2.8*  --  2.5*  --   --   --  3.4*  --   --   --   --   --  3.1*  PHOS 6.5*  --  6.5*  --  3.8  --   --   --   --   --   --   --   --   --  2.9   < > = values in this interval not displayed.   GFR: Estimated Creatinine Clearance: 52.5 mL/min (A) (by C-G formula based on SCr of 1.92 mg/dL (H)). Recent Labs  Lab 09/19/19 0356 09/20/19 1500 09/21/19 1238 09/24/19 0500  WBC 14.2* 15.5* 17.1* 12.3*    Liver Function Tests: Recent Labs  Lab 09/18/19 0820 09/19/19 0356 09/20/19 0559 09/24/19 0500  AST 25 19 31   --   ALT 48* 38 53*  --   ALKPHOS 87 73 79  --   BILITOT 1.9* 1.4* 2.3*  --   PROT 6.9 6.7 6.5  --   ALBUMIN 2.8* 2.6* 2.5* 1.9*   No results for input(s): LIPASE, AMYLASE in the last 168 hours. No results for input(s): AMMONIA in the last 168 hours.  ABG    Component Value Date/Time   PHART 7.314 (L) 09/24/2019 0450   PCO2ART 66.8 (HH) 09/24/2019 0450   PO2ART 67 (L) 09/24/2019 0450   HCO3 33.3 (H) 09/24/2019 0450   TCO2 35 (H) 09/24/2019 0450   ACIDBASEDEF 2.0 10/01/19 1059   O2SAT 88.0 09/24/2019 0450     Coagulation Profile: No results for input(s): INR, PROTIME in the last 168 hours.  Cardiac Enzymes: No results for input(s): CKTOTAL, CKMB, CKMBINDEX, TROPONINI in the last 168 hours.  HbA1C: Hgb A1c MFr Bld  Date/Time Value Ref Range Status  09/17/2019 08:18 AM 6.0 (H) 4.8 - 5.6 % Final    Comment:    (NOTE) Pre diabetes:          5.7%-6.4%  Diabetes:              >6.4%  Glycemic control for   <7.0% adults with diabetes     CBG: Recent Labs  Lab 09/23/19 1943 09/23/19 2344 09/24/19 0325 09/24/19 0803 09/24/19 1157  GLUCAP 167* 185* 213* 160* 185*     This patient is critically ill with  multiple organ system failure which requires frequent high complexity decision making, assessment, support, evaluation, and titration of therapies. This was completed through the application of advanced monitoring technologies and extensive interpretation of multiple databases. During this encounter critical care time was devoted to patient care services described in this note for 40 minutes.   09/26/19, DO 09/24/19 8:06 PM [facility]

## 2019-09-24 NOTE — Progress Notes (Signed)
°  Echocardiogram 2D Echocardiogram has been performed.  Samuel Peterson 09/24/2019, 4:37 PM

## 2019-09-25 ENCOUNTER — Inpatient Hospital Stay (HOSPITAL_COMMUNITY): Payer: Federal, State, Local not specified - PPO

## 2019-09-25 DIAGNOSIS — J9601 Acute respiratory failure with hypoxia: Secondary | ICD-10-CM | POA: Diagnosis not present

## 2019-09-25 DIAGNOSIS — J168 Pneumonia due to other specified infectious organisms: Secondary | ICD-10-CM

## 2019-09-25 DIAGNOSIS — J95851 Ventilator associated pneumonia: Secondary | ICD-10-CM | POA: Diagnosis not present

## 2019-09-25 DIAGNOSIS — K567 Ileus, unspecified: Secondary | ICD-10-CM | POA: Diagnosis not present

## 2019-09-25 DIAGNOSIS — R7881 Bacteremia: Secondary | ICD-10-CM | POA: Diagnosis not present

## 2019-09-25 DIAGNOSIS — A498 Other bacterial infections of unspecified site: Secondary | ICD-10-CM

## 2019-09-25 DIAGNOSIS — U071 COVID-19: Secondary | ICD-10-CM | POA: Diagnosis not present

## 2019-09-25 LAB — GLUCOSE, CAPILLARY
Glucose-Capillary: 130 mg/dL — ABNORMAL HIGH (ref 70–99)
Glucose-Capillary: 133 mg/dL — ABNORMAL HIGH (ref 70–99)
Glucose-Capillary: 168 mg/dL — ABNORMAL HIGH (ref 70–99)
Glucose-Capillary: 174 mg/dL — ABNORMAL HIGH (ref 70–99)
Glucose-Capillary: 175 mg/dL — ABNORMAL HIGH (ref 70–99)
Glucose-Capillary: 200 mg/dL — ABNORMAL HIGH (ref 70–99)

## 2019-09-25 LAB — RENAL FUNCTION PANEL
Albumin: 1.9 g/dL — ABNORMAL LOW (ref 3.5–5.0)
Anion gap: 10 (ref 5–15)
BUN: 89 mg/dL — ABNORMAL HIGH (ref 6–20)
CO2: 31 mmol/L (ref 22–32)
Calcium: 8.3 mg/dL — ABNORMAL LOW (ref 8.9–10.3)
Chloride: 110 mmol/L (ref 98–111)
Creatinine, Ser: 2.31 mg/dL — ABNORMAL HIGH (ref 0.61–1.24)
GFR calc Af Amer: 36 mL/min — ABNORMAL LOW (ref >60)
GFR calc non Af Amer: 31 mL/min — ABNORMAL LOW (ref >60)
Glucose, Bld: 126 mg/dL — ABNORMAL HIGH (ref 70–99)
Phosphorus: 4.2 mg/dL (ref 2.5–4.6)
Potassium: 4.8 mmol/L (ref 3.5–5.1)
Sodium: 151 mmol/L — ABNORMAL HIGH (ref 135–145)

## 2019-09-25 LAB — CBC
HCT: 42.9 % (ref 39.0–52.0)
Hemoglobin: 13 g/dL (ref 13.0–17.0)
MCH: 32.6 pg (ref 26.0–34.0)
MCHC: 30.3 g/dL (ref 30.0–36.0)
MCV: 107.5 fL — ABNORMAL HIGH (ref 80.0–100.0)
Platelets: 279 10*3/uL (ref 150–400)
RBC: 3.99 MIL/uL — ABNORMAL LOW (ref 4.22–5.81)
RDW: 12.2 % (ref 11.5–15.5)
WBC: 16.5 10*3/uL — ABNORMAL HIGH (ref 4.0–10.5)
nRBC: 0.4 % — ABNORMAL HIGH (ref 0.0–0.2)

## 2019-09-25 LAB — CULTURE, BLOOD (ROUTINE X 2): Special Requests: ADEQUATE

## 2019-09-25 LAB — HEPARIN ANTI-XA: Heparin LMW: 1.5 IU/mL

## 2019-09-25 MED ORDER — SENNOSIDES-DOCUSATE SODIUM 8.6-50 MG PO TABS
3.0000 | ORAL_TABLET | Freq: Every day | ORAL | Status: DC
  Administered 2019-09-26 – 2019-09-29 (×4): 3
  Filled 2019-09-25 (×4): qty 3

## 2019-09-25 MED ORDER — FUROSEMIDE 10 MG/ML IJ SOLN: 60.0000 mg | Freq: Two times a day (BID) | INTRAMUSCULAR | Status: DC

## 2019-09-25 MED ORDER — ENOXAPARIN SODIUM 80 MG/0.8ML ~~LOC~~ SOLN
80.0000 mg | Freq: Two times a day (BID) | SUBCUTANEOUS | Status: DC
  Administered 2019-09-26 (×3): 80 mg via SUBCUTANEOUS
  Filled 2019-09-25 (×4): qty 0.8

## 2019-09-25 MED ORDER — FLEET ENEMA 7-19 GM/118ML RE ENEM
1.0000 | ENEMA | Freq: Every day | RECTAL | Status: DC | PRN
  Administered 2019-09-25: 1 via RECTAL
  Filled 2019-09-25 (×2): qty 1

## 2019-09-25 MED ORDER — VITAL 1.5 CAL PO LIQD
1000.0000 mL | ORAL | Status: DC
  Administered 2019-09-26 – 2019-09-28 (×3): 1000 mL
  Filled 2019-09-25 (×3): qty 1000

## 2019-09-25 MED ORDER — FREE WATER
300.0000 mL | Status: DC
  Administered 2019-09-25 – 2019-09-29 (×37): 300 mL

## 2019-09-25 MED ORDER — BISACODYL 10 MG RE SUPP
10.0000 mg | Freq: Once | RECTAL | Status: AC
  Administered 2019-09-25: 10 mg via RECTAL
  Filled 2019-09-25: qty 1

## 2019-09-25 MED ORDER — DEXTROSE 5 % IV SOLN: INTRAVENOUS | Status: DC

## 2019-09-25 MED ORDER — DEXTROSE-NACL 5-0.45 % IV SOLN: INTRAVENOUS | Status: DC

## 2019-09-25 MED ORDER — POLYETHYLENE GLYCOL 3350 17 G PO PACK
17.0000 g | PACK | Freq: Every day | ORAL | Status: DC
  Administered 2019-09-25 – 2019-09-29 (×5): 17 g
  Filled 2019-09-25 (×5): qty 1

## 2019-09-25 NOTE — Progress Notes (Signed)
eLink Physician-Brief Progress Note Patient Name: Samuel Peterson DOB: October 05, 1965 MRN: [id number]   Date of Service  09/25/2019  HPI/Events of Note  Notified of Na 151 from 149 On free water flushes as well as D5 0.45% at 50cc/hr  eICU Interventions  Switched to D5W at same rate     Intervention Category Major Interventions: Electrolyte abnormality - evaluation and management  Darl Pikes 09/25/2019, 11:07 PM

## 2019-09-25 NOTE — Progress Notes (Signed)
eLink Physician-Brief Progress Note Patient Name: CALLAWAY HAILES DOB: [birth year] MRN: [id number]   Date of Service  09/25/2019  HPI/Events of Note  Camera for low MAP.arthritis line MAP back to 75. PIP on Vent < 30. 50% fio2.   eICU Interventions  Continue ID care for VAP. Call back if MAP dips. Has only PIV.      Intervention Category Intermediate Interventions: Hypotension - evaluation and management  Ranee Gosselin 09/25/2019, 2:16 AM

## 2019-09-25 NOTE — Progress Notes (Signed)
Patient ID: Samuel Peterson, male   DOB: 1965-04-06, 54 y.o.   MRN: [id number]          [facility]    Date of Admission:  09/16/2019   Day 2 vancomycin        Day 2 cefepime  Blood cultures are growing MSSA, methicillin-resistant staph epi and Serratia marcescens.  Repeat blood cultures remain negative.  No evidence of endocarditis was found on TTE.  I do not feel that there is any indication to proceed to TEE.  I recommend continuing current antibiotics for now.         Cliffton Asters, MD [facility] for Infectious Disease [facility] [phone] pager   [phone] cell 09/25/2019, 10:43 AM

## 2019-09-25 NOTE — Progress Notes (Signed)
Nutrition Follow-up  DOCUMENTATION CODES:   Not applicable  INTERVENTION:   Tube Feeding via Cortrak (post-pyloric): Vital 1.5 at 65 ml/hr Pro-Source TF 45 mL TID Provides 138 g of protein, 2460 kcals and 1186 mL of free water Meets 100% estimated calorie and protein needs   NUTRITION DIAGNOSIS:   Inadequate oral intake related to acute illness as evidenced by NPO status.  Being addressed via TF   GOAL:   Patient will meet greater than or equal to 90% of their needs  Met via TF  MONITOR:   Vent status, TF tolerance, Weight trends, Labs  REASON FOR ASSESSMENT:   Consult, Ventilator Enteral/tube feeding initiation and management  ASSESSMENT:   54 yo male admitted with COVID-19 pneumonia on 8/26, ultimately requiring transfer to ICU with intubation on 9/7. PMH includes PE, sleep apnea, nasal sinus and septum surgery  8/26 COVID-19 dx 8/31 Admitted 9/07 Intubated 9/10 Cortrak-post pyloric  Pt remains on vent support, febrile. Per Md notes, likely needs trach  Vital 1.5 at 60 ml/hr via Cortrak  Pt had been vomiting, ileus. Cortrak placed post-pyloric and TF were resume; pt has been tolerating NG in for decompression. Abd xray with negative for ileus.   Hypernatremic, D5-1/2 NS at 50 ml/hr, Free ater flush 300 mL q 2 hours  Current weight 100.3 kg; admit weight 113.4 kg.   No skin breakdown per RN skin assessment  Labs: sodium 151 (H), Creatinine 2.31, BUN 89, CBGs 130-193 (ICU goal 140-180) Meds: ss novolog, lantus, lkelma  Diet Order:   Diet Order    None      EDUCATION NEEDS:   Not appropriate for education at this time  Skin:  Skin Assessment: Reviewed RN Assessment  Last BM:  9/15 rectal tube  Height:   Ht Readings from Last 1 Encounters:  09/11/19 5' 9.5" (1.765 m)    Weight:   Wt Readings from Last 1 Encounters:  09/25/19 100.3 kg    BMI:  Body mass index is 32.19 kg/m.  Estimated Nutritional Needs:   Kcal:  2400-2600  kcals  Protein:  125-150 g  Fluid:  >/= 2 L   Kerman Passey MS, RDN, LDN, CNSC Registered Dietitian III Clinical Nutrition RD Pager and On-Call Pager Number Located in [place]

## 2019-09-25 NOTE — Progress Notes (Signed)
Episodes of hypotension of mid to low 80's. E-link made aware.

## 2019-09-25 NOTE — Progress Notes (Signed)
NAME:  Samuel Peterson, MRN:  [id number], DOB:  [birth year], LOS: 15 ADMISSION DATE:  2019/09/15, CONSULTATION DATE:  09/14/19 REFERRING MD:  Criselda Peaches, CHIEF COMPLAINT:  COVID-19   Brief History   54yo male with PMH of PE on eliquis and recurrent respiratory infections not requiring hospitalization presenting with covid-19 pneumonia, first diagnosed 8/26, admitted with worsening shortness of breath and new oxygen requirement.  Transferred to ICU 9/6 and required intubation 9/7.   Past Medical History  PE OSA on CPAP  Significant Hospital Events   8/31> admitted 9/6> ICU transfer  9/15 sedated. Na still high 151. Renal fxn little worse. Added LOC for no BM  Consults:    Procedures:  09/17/2019 intubation>> 09/18/2019  arterial line >>  Significant Diagnostic Tests:  8/31 CXR> diffuse bilateral interstitial opacities  9/6 CTA PE >> Poor evaluation for pulmonary embolism. No central or large lobar embolism identified; Emphysema; Mosaic attenuation, favored to be related to patchy basilar predominant ground-glass  09/18/2019 chest x-ray with bilateral airspace disease crepitation 09/20/2019 CXR -bilateral interstitial airspace disease with minimal progression 09/20/2019 KUB-colonic dilatation consistent with ileus. 9/14 echocardiogram:1. There is a dynamic mid cavitary systolic gradient at rest of consistent with hyperdynamic LVF. Marland Kitchen Left ventricular ejection fraction, by estimation, is 65 to 70%. The left ventricle has normal function. The left ventricle has no regional wall motion abnormalities. There is severe left ventricular hypertrophy of the basal-septal segment. Left ventricular diastolic parameters are consistent with Grade I diastolic dysfunction (impaired relaxation). Elevated left ventricular end-diastolic pressure.  Micro Data:  8/31 Blood cultures x2 >> neg 9/6 MRSA >> neg 9/12 trach EGB:[id number] SA and mod enterobacter  9/12 BCx2 >> serratia, MRSE, staph aureus (on  biofire & cx) 9/14 blood cx>   Antimicrobials:  vanc 9/13 >> Cefepime 9/13 >> remdesivir 8/31> 9/4 baricitinib 9/1 >> 9/12 Decadron 9/1 >>9/9  Interim history/subjective:  Febrile  At 101  Objective   Blood pressure 94/63, pulse (!) 117, temperature (!) 101.2 F (38.4 C), temperature source Oral, resp. rate (!) 22, height 5' 9.5" (1.765 m), weight 100.3 kg, SpO2 93 %.    Vent Mode: PRVC FiO2 (%):  [50 %] 50 % Set Rate:  [24 bmp] 24 bmp Vt Set:  [430 mL] 430 mL PEEP:  [14 cmH20] 14 cmH20 Plateau Pressure:  [28 cmH20-30 cmH20] 28 cmH20   Intake/Output Summary (Last 24 hours) at 09/25/2019 0954 Last data filed at 09/25/2019 0900 Gross per 24 hour  Intake 1856.84 ml  Output 2880 ml  Net -1023.16 ml   Filed Weights   09/23/19 0500 09/24/19 0500 09/25/19 0500  Weight: 101.3 kg 100.8 kg 100.3 kg   Examination: General this is a 54 year old male sedated on diprivan gtt HENT NCAT orally intubated  Pulm decreased bilaterally PPlat 29/driving pressure is 15 currently on peep 14/fio2 50% Card tachy rrr abd not tender and soft but no BM  gu cnc yellow  Neuro sedated Ext warm w/ dependent edema   Resolved Hospital Problem list   Ileus  Episodes of bradycardia, resolved off of Precedex Assessment & Plan:    Acute hypoxic and hypercapneic respiratory failure due to COVID-19 Pneumonia  And HCAP -intubated 09/17/19 -pcxr reviewed: sig worse R>L airspace disease.  Plan Cont lowe Vt ventilation 6-8 ml/kg pbw pplat goal < 30/driving pressure goal < 15 Pao2 goal 55-65 Daily assessment for SBT VAP bundle PAD protocol Can dc resp isolation on 9/18 He is likely going to need trach  (ill  d/w family-->might be able to do this Friday)  Sepsis 2/2 Serratia & MRSE bacteremia Serratia and staph aureus pneumonia TTE was neg for endocarditis  Plan MAP goal > 65 (pressors as needed) Day 3 vanc and cefepime length of rx TBD (ID following) F/u repeat blood cultures  History of  PE Plan Cont LMWH per pharmacy  Holding apixaban (in case we need to do procedures)   AKI-initially tonight Likely 2/2 urinary retention (foley replaced 9/11) and cr had improved. Now scr worse again \ Plan Avoid volume depletion  Keep foley in place Strict I&O Renal dose meds Am chem   Hypernatremia: na increasing Plan Increased free water flushes. If continues to climb will replace w/ d5W Am chemistry    Constipation  Plan Adding bowel regimen   Hyperglycemia due to steroids Plan Cont ssi and lantus Cont cbg goal 140-180  Daily Goals Checklist  Pain/Anxiety/Delirium protocol (if indicated): Continue Dilaudid gtt / prn versed for RASS goal -2/ vent synchrony  VAP protocol (if indicated): yes Blood pressure target: Currently on no vasopressors. DVT prophylaxis: lovenox - full dose GI prophylaxis: Protonix BID Urinary catheter: foley for urinary retention Glucose control: SSI Mobility/therapy needs: Bedrest Code Status: Full Family Communication: Updated wife Jasmine December. Disposition: ICU  My cct 34 min  Simonne Martinet ACNP-BC [facility] Pulmonary/Critical Care Pager # [phone] OR # [phone] if no answer

## 2019-09-25 NOTE — Progress Notes (Signed)
eLink Physician-Brief Progress Note Patient Name: RITESH OPARA DOB: Mar 25, 1965 MRN: [id number]   Date of Service  09/25/2019  HPI/Events of Note  can we get KUB?? been stgruggling with ileus, some tube feed intolerance and no BM recorded  eICU Interventions  KUB ordered     Intervention Category Minor Interventions: Other:  Ranee Gosselin 09/25/2019, 5:11 AM

## 2019-09-26 ENCOUNTER — Inpatient Hospital Stay (HOSPITAL_COMMUNITY): Payer: Federal, State, Local not specified - PPO

## 2019-09-26 DIAGNOSIS — J95851 Ventilator associated pneumonia: Secondary | ICD-10-CM | POA: Diagnosis not present

## 2019-09-26 DIAGNOSIS — U071 COVID-19: Secondary | ICD-10-CM | POA: Diagnosis not present

## 2019-09-26 DIAGNOSIS — J189 Pneumonia, unspecified organism: Secondary | ICD-10-CM | POA: Diagnosis not present

## 2019-09-26 DIAGNOSIS — A498 Other bacterial infections of unspecified site: Secondary | ICD-10-CM | POA: Diagnosis not present

## 2019-09-26 DIAGNOSIS — J9601 Acute respiratory failure with hypoxia: Secondary | ICD-10-CM | POA: Diagnosis not present

## 2019-09-26 LAB — RENAL FUNCTION PANEL
Albumin: 1.7 g/dL — ABNORMAL LOW (ref 3.5–5.0)
Anion gap: 9 (ref 5–15)
BUN: 114 mg/dL — ABNORMAL HIGH (ref 6–20)
CO2: 30 mmol/L (ref 22–32)
Calcium: 7.9 mg/dL — ABNORMAL LOW (ref 8.9–10.3)
Chloride: 106 mmol/L (ref 98–111)
Creatinine, Ser: 2.96 mg/dL — ABNORMAL HIGH (ref 0.61–1.24)
GFR calc Af Amer: 27 mL/min — ABNORMAL LOW (ref >60)
GFR calc non Af Amer: 23 mL/min — ABNORMAL LOW (ref >60)
Glucose, Bld: 239 mg/dL — ABNORMAL HIGH (ref 70–99)
Phosphorus: 5.7 mg/dL — ABNORMAL HIGH (ref 2.5–4.6)
Potassium: 4.6 mmol/L (ref 3.5–5.1)
Sodium: 145 mmol/L (ref 135–145)

## 2019-09-26 LAB — CBC
HCT: 38.9 % — ABNORMAL LOW (ref 39.0–52.0)
Hemoglobin: 11.6 g/dL — ABNORMAL LOW (ref 13.0–17.0)
MCH: 32.6 pg (ref 26.0–34.0)
MCHC: 29.8 g/dL — ABNORMAL LOW (ref 30.0–36.0)
MCV: 109.3 fL — ABNORMAL HIGH (ref 80.0–100.0)
Platelets: 276 10*3/uL (ref 150–400)
RBC: 3.56 MIL/uL — ABNORMAL LOW (ref 4.22–5.81)
RDW: 12.4 % (ref 11.5–15.5)
WBC: 15.1 10*3/uL — ABNORMAL HIGH (ref 4.0–10.5)
nRBC: 0.4 % — ABNORMAL HIGH (ref 0.0–0.2)

## 2019-09-26 LAB — GLUCOSE, CAPILLARY
Glucose-Capillary: 132 mg/dL — ABNORMAL HIGH (ref 70–99)
Glucose-Capillary: 174 mg/dL — ABNORMAL HIGH (ref 70–99)
Glucose-Capillary: 177 mg/dL — ABNORMAL HIGH (ref 70–99)
Glucose-Capillary: 178 mg/dL — ABNORMAL HIGH (ref 70–99)
Glucose-Capillary: 209 mg/dL — ABNORMAL HIGH (ref 70–99)
Glucose-Capillary: 227 mg/dL — ABNORMAL HIGH (ref 70–99)

## 2019-09-26 LAB — CULTURE, RESPIRATORY W GRAM STAIN

## 2019-09-26 MED ORDER — LINEZOLID 600 MG/300ML IV SOLN
600.0000 mg | Freq: Two times a day (BID) | INTRAVENOUS | Status: DC
  Administered 2019-09-26 – 2019-09-27 (×3): 600 mg via INTRAVENOUS
  Filled 2019-09-26 (×4): qty 300

## 2019-09-26 MED ORDER — LINEZOLID 600 MG/300ML IV SOLN
600.0000 mg | Freq: Two times a day (BID) | INTRAVENOUS | Status: DC
  Filled 2019-09-26: qty 300

## 2019-09-26 NOTE — Progress Notes (Signed)
Inpatient Diabetes Program Recommendations  AACE/ADA: New Consensus Statement on Inpatient Glycemic Control  Target Ranges:  Prepandial:   less than 140 mg/dL      Peak postprandial:   less than 180 mg/dL (1-2 hours)      Critically ill patients:  140 - 180 mg/dL  Results for DIAMANTE, RUBIN (MRN [id number]) as of 09/26/2019 11:44  Ref. Range 09/25/2019 07:44 09/25/2019 11:54 09/25/2019 15:29 09/25/2019 19:41 09/25/2019 23:36 09/26/2019 03:33 09/26/2019 07:38 09/26/2019 11:21  Glucose-Capillary Latest Ref Range: 70 - 99 mg/dL 400 (H) 867 (H) 619 (H) 175 (H) 200 (H) 209 (H) 178 (H) 174 (H)    Review of Glycemic Control  Current orders for Inpatient glycemic control: Lantus 10 units daily, Novolog 0-15 units Q4H; Vital @ 65 ml/hr  Inpatient Diabetes Program Recommendations:    Insulin-Tube Feeding coverage:  Please consider ordering Novolog 4 units Q4H for tube feeding coverage. If tube feeding is stopped or held then Novolog tube feeding coverage should also be stopped or held.  Thanks, Orlando Penner, RN, MSN, CDE Diabetes Coordinator Inpatient Diabetes Program [phone] (Team Pager from 8am to 5pm)

## 2019-09-26 NOTE — Progress Notes (Signed)
NAME:  Samuel Peterson, MRN:  [id number], DOB:  01/03/1966, LOS: 16 ADMISSION DATE:  09/01/2019, CONSULTATION DATE:  09/14/19 REFERRING MD:  Criselda Peaches, CHIEF COMPLAINT:  COVID-19   Brief History   54yo male with PMH of PE on eliquis and recurrent respiratory infections not requiring hospitalization presenting with covid-19 pneumonia, first diagnosed 8/26, admitted with worsening shortness of breath and new oxygen requirement.  Transferred to ICU 9/6 and required intubation 9/7.   Past Medical History  PE OSA on CPAP  Significant Hospital Events   8/31> admitted 9/6> ICU transfer  9/15 sedated. Na still high 151. Renal fxn little worse. Added LOC for no BM 9/16 creatinine worse.   Consults:    Procedures:  09/17/2019 intubation>> 09/18/2019  arterial line >>  Significant Diagnostic Tests:  8/31 CXR> diffuse bilateral interstitial opacities  9/6 CTA PE >> Poor evaluation for pulmonary embolism. No central or large lobar embolism identified; Emphysema; Mosaic attenuation, favored to be related to patchy basilar predominant ground-glass  09/18/2019 chest x-ray with bilateral airspace disease crepitation 09/20/2019 CXR -bilateral interstitial airspace disease with minimal progression 09/20/2019 KUB-colonic dilatation consistent with ileus. 9/14 echocardiogram:1. There is a dynamic mid cavitary systolic gradient at rest of consistent with hyperdynamic LVF. Marland Kitchen Left ventricular ejection fraction, by estimation, is 65 to 70%. The left ventricle has normal function. The left ventricle has no regional wall motion abnormalities. There is severe left ventricular hypertrophy of the basal-septal segment. Left ventricular diastolic parameters are consistent with Grade I diastolic dysfunction (impaired relaxation). Elevated left ventricular end-diastolic pressure.  Micro Data:  8/31 Blood cultures x2 >> neg 9/6 MRSA >> neg 9/12 trach CHY:[id number] SA and mod enterobacter  9/12 BCx2 >> serratia,  MRSE, staph aureus (on biofire & cx) 9/14 blood cx>   Antimicrobials:  vanc 9/13 >> Cefepime 9/13 >> remdesivir 8/31> 9/4 baricitinib 9/1 >> 9/12 Decadron 9/1 >>9/9  Interim history/subjective:  Fever curve stable  Wbc curve a little better  Objective   Blood pressure 118/68, pulse (!) 114, temperature 99.8 F (37.7 C), temperature source Oral, resp. rate (!) 30, height 5' 9.5" (1.765 m), weight 100.8 kg, SpO2 97 %.    Vent Mode: PRVC FiO2 (%):  [50 %] 50 % Set Rate:  [24 bmp] 24 bmp Vt Set:  [430 mL] 430 mL PEEP:  [12 cmH20-14 cmH20] 12 cmH20 Plateau Pressure:  [20 cmH20-22 cmH20] 20 cmH20   Intake/Output Summary (Last 24 hours) at 09/26/2019 0949 Last data filed at 09/26/2019 0830 Gross per 24 hour  Intake 2237.2 ml  Output 2160 ml  Net 77.2 ml   Filed Weights   09/24/19 0500 09/25/19 0500 09/26/19 0439  Weight: 100.8 kg 100.3 kg 100.8 kg   Examination: General: This 54 year old male patient currently on full ventilatory support HEENT normocephalic atraumatic orally intubated Pulmonary: Diminished bilaterally with scattered rhonchi.  Currently on PEEP 12/FiO2 50%.  Plateau pressure 20 Cardiac: Regular rate and rhythm Abdomen: Soft nontender Extremities: Warm dry with dependent edema Neuro: Sedated on Dilaudid infusion  Resolved Hospital Problem list   Ileus  Episodes of bradycardia, resolved off of Precedex Assessment & Plan:    Acute hypoxic and hypercapneic respiratory failure due to COVID-19 Pneumonia  And HCAP -intubated 09/17/19 -Portable chest x-ray personally reviewed: Support tubes and lines in good position.  Right greater than left airspace disease, however there is improved aeration when comparing film from the 14th particularly on the right side Plan Continue low tidal volume ventilation at 6 mL/kg predicted  body weight Plateau pressure goal less than 30, driving pressure goal less than 15 Continue to wean PEEP/FiO2 for PaO2 goal 55-65 Continue VAP  bundle IV Lasix Can discontinue respiratory precautions soon, will need to recalculate this, not clear if that date is actually today or the 18th He is going to need tracheostomy, will plan for this either tomorrow or Monday   Sepsis 2/2 Serratia & MRSE bacteremia Serratia and staph aureus pneumonia TTE was neg for endocarditis  Plan Antibiotic day #4, now Vanco and cefepime, infectious diseases following, length of therapy to be determined, follow-up cultures negative to date.  Will likely need to change vancomycin to Zyvox given worsening renal function but will defer this to infectious disease   History of PE Plan Continue low molecular weight heparin per pharmacy  Holding apixaban at this point, will likely need tracheostomy either tomorrow or early next week    AKI-initially tonight Likely 2/2 urinary retention (foley replaced 9/11) and cr had improved. Now scr worse again worried this could be drug related Plan Keep euvolemic Avoid nephrotoxins, will likely transition to Zyvox A.m. chemistry  Hypernatremia: na increasing, now regulated/normalized Plan Continue free water flushes discontinue D5 water  Constipation  Plan Keep bowel regimen  Hyperglycemia due to steroids Plan Continue sliding scale insulin with goal blood glucose 140-180 No change in current basal dosing  Daily Goals Checklist  Pain/Anxiety/Delirium protocol (if indicated): Continue Dilaudid gtt / prn versed for RASS goal -2/ vent synchrony  VAP protocol (if indicated): yes Blood pressure target: Currently on no vasopressors. DVT prophylaxis: lovenox - full dose GI prophylaxis: Protonix BID Urinary catheter: foley for urinary retention Glucose control: SSI Mobility/therapy needs: Bedrest Code Status: Full Family Communication: Updated wife Jasmine December. Disposition: ICU My cct 34 min  Simonne Martinet ACNP-BC [facility] Pulmonary/Critical Care Pager # [phone] OR # [phone] if no answer

## 2019-09-26 NOTE — Progress Notes (Signed)
Patient ID: Samuel Peterson, male   DOB: [birth year], 54 y.o.   MRN: [id number]          [facility] for Infectious Disease    Date of Admission:  09/02/2019   Day 4 vancomycin        Day 4 cefepime  He is developing acute renal insufficiency on vancomycin so we will switch to linezolid to complete 2 weeks of therapy for his MSSA and methicillin-resistant staph epi bacteremias.  I would continue cefepime for 3 more days to cover the Serratia bacteremia.  This antibiotic regimen should also cover for healthcare associated pneumonia.  Plan: 1. Change vancomycin to linezolid and treat for 10 more days through 10/06/2019 2. Continue cefepime for 3 more days through 01-Oct-2019 3. Please call if I can be of further assistance         Cliffton Asters, MD [facility] Infect[facility] [phone] pager   [phone] cell 09/26/2019, 3:52 PM

## 2019-09-26 NOTE — Progress Notes (Signed)
Pt transferred from 3M07 to 2M03 without incident.

## 2019-09-27 ENCOUNTER — Inpatient Hospital Stay (HOSPITAL_COMMUNITY): Payer: Federal, State, Local not specified - PPO

## 2019-09-27 DIAGNOSIS — A419 Sepsis, unspecified organism: Secondary | ICD-10-CM | POA: Diagnosis not present

## 2019-09-27 DIAGNOSIS — J189 Pneumonia, unspecified organism: Secondary | ICD-10-CM | POA: Diagnosis not present

## 2019-09-27 LAB — RENAL FUNCTION PANEL
Albumin: 1.7 g/dL — ABNORMAL LOW (ref 3.5–5.0)
Anion gap: 8 (ref 5–15)
BUN: 124 mg/dL — ABNORMAL HIGH (ref 6–20)
CO2: 28 mmol/L (ref 22–32)
Calcium: 7.7 mg/dL — ABNORMAL LOW (ref 8.9–10.3)
Chloride: 107 mmol/L (ref 98–111)
Creatinine, Ser: 3.31 mg/dL — ABNORMAL HIGH (ref 0.61–1.24)
GFR calc Af Amer: 23 mL/min — ABNORMAL LOW (ref >60)
GFR calc non Af Amer: 20 mL/min — ABNORMAL LOW (ref >60)
Glucose, Bld: 122 mg/dL — ABNORMAL HIGH (ref 70–99)
Phosphorus: 5.9 mg/dL — ABNORMAL HIGH (ref 2.5–4.6)
Potassium: 4.4 mmol/L (ref 3.5–5.1)
Sodium: 143 mmol/L (ref 135–145)

## 2019-09-27 LAB — GLUCOSE, CAPILLARY
Glucose-Capillary: 111 mg/dL — ABNORMAL HIGH (ref 70–99)
Glucose-Capillary: 112 mg/dL — ABNORMAL HIGH (ref 70–99)
Glucose-Capillary: 137 mg/dL — ABNORMAL HIGH (ref 70–99)
Glucose-Capillary: 118 mg/dL — ABNORMAL HIGH (ref 70–99)
Glucose-Capillary: 163 mg/dL — ABNORMAL HIGH (ref 70–99)
Glucose-Capillary: 215 mg/dL — ABNORMAL HIGH (ref 70–99)

## 2019-09-27 LAB — CBC
HCT: 37 % — ABNORMAL LOW (ref 39.0–52.0)
Hemoglobin: 11.2 g/dL — ABNORMAL LOW (ref 13.0–17.0)
MCH: 31.8 pg (ref 26.0–34.0)
MCHC: 30.3 g/dL (ref 30.0–36.0)
MCV: 105.1 fL — ABNORMAL HIGH (ref 80.0–100.0)
Platelets: 284 10*3/uL (ref 150–400)
RBC: 3.52 MIL/uL — ABNORMAL LOW (ref 4.22–5.81)
RDW: 12.3 % (ref 11.5–15.5)
WBC: 15.9 10*3/uL — ABNORMAL HIGH (ref 4.0–10.5)
nRBC: 0.7 % — ABNORMAL HIGH (ref 0.0–0.2)

## 2019-09-27 LAB — HEPARIN ANTI-XA: Heparin LMW: 1.11 IU/mL

## 2019-09-27 MED ORDER — DEXMEDETOMIDINE HCL IN NACL 400 MCG/100ML IV SOLN
0.2000 ug/kg/h | INTRAVENOUS | Status: DC
  Administered 2019-09-27: 0.4 ug/kg/h via INTRAVENOUS
  Administered 2019-09-28: 1 ug/kg/h via INTRAVENOUS
  Administered 2019-09-28 (×2): 0.6 ug/kg/h via INTRAVENOUS
  Filled 2019-09-27 (×5): qty 100

## 2019-09-27 MED ORDER — LACTATED RINGERS IV BOLUS
500.0000 mL | Freq: Once | INTRAVENOUS | Status: AC
  Administered 2019-09-27: 500 mL via INTRAVENOUS

## 2019-09-27 MED ORDER — LACTATED RINGERS IV BOLUS
250.0000 mL | Freq: Once | INTRAVENOUS | Status: AC
  Administered 2019-09-27: 250 mL via INTRAVENOUS

## 2019-09-27 MED ORDER — HEPARIN (PORCINE) 25000 UT/250ML-% IV SOLN
1650.0000 [IU]/h | INTRAVENOUS | Status: DC
  Administered 2019-09-27: 1400 [IU]/h via INTRAVENOUS
  Administered 2019-09-28: 1500 [IU]/h via INTRAVENOUS
  Administered 2019-09-29: 1650 [IU]/h via INTRAVENOUS
  Filled 2019-09-27 (×3): qty 250

## 2019-09-27 NOTE — Progress Notes (Signed)
NAME:  Samuel Peterson, MRN:  [id number], DOB:  July 31, 1965, LOS: 17 ADMISSION DATE:  09/09/2019, CONSULTATION DATE:  09/14/19 REFERRING MD:  Criselda Peaches, CHIEF COMPLAINT:  COVID-19   Brief History   54yo male with PMH of PE on eliquis and recurrent respiratory infections not requiring hospitalization presenting with covid-19 pneumonia, first diagnosed 8/26, admitted with worsening shortness of breath and new oxygen requirement.  Transferred to ICU 9/6 and required intubation 9/7.   Past Medical History  PE OSA on CPAP  Significant Hospital Events   8/31> admitted 9/6> ICU transfer  9/15 sedated. Na still high 151. Renal fxn little worse. Added LOC for no BM 9/16 creatinine worse.   Consults:    Procedures:  09/17/2019 intubation>> 09/18/2019  arterial line >>  Significant Diagnostic Tests:  8/31 CXR> diffuse bilateral interstitial opacities  9/6 CTA PE >> Poor evaluation for pulmonary embolism. No central or large lobar embolism identified; Emphysema; Mosaic attenuation, favored to be related to patchy basilar predominant ground-glass  09/18/2019 chest x-ray with bilateral airspace disease crepitation 09/20/2019 CXR -bilateral interstitial airspace disease with minimal progression 09/20/2019 KUB-colonic dilatation consistent with ileus. 9/14 echocardiogram:1. There is a dynamic mid cavitary systolic gradient at rest of consistent with hyperdynamic LVF. Marland Kitchen Left ventricular ejection fraction, by estimation, is 65 to 70%. The left ventricle has normal function. The left ventricle has no regional wall motion abnormalities. There is severe left ventricular hypertrophy of the basal-septal segment. Left ventricular diastolic parameters are consistent with Grade I diastolic dysfunction (impaired relaxation). Elevated left ventricular end-diastolic pressure.  Micro Data:  8/31 Blood cultures x2 >> neg 9/6 MRSA >> neg 9/12 trach YQI:[id number] SA and mod enterobacter  9/12 BCx2 >> serratia,  MRSE, staph aureus (on biofire & cx) 9/14 blood cx> NGTD  Antimicrobials:  vanc 9/13 >> Cefepime 9/13 >> remdesivir 8/31> 9/4 baricitinib 9/1 >> 9/12 Decadron 9/1 >>9/9  Interim history/subjective:   Febrile to 100.5 this morning. Having good urine output.   Objective   Blood pressure (!) 118/59, pulse (!) 120, temperature (!) 100.5 F (38.1 C), temperature source Axillary, resp. rate 17, height 5' 9.5" (1.765 m), weight 106.7 kg, SpO2 (!) 89 %.    Vent Mode: PRVC FiO2 (%):  [50 %-70 %] 70 % Set Rate:  [24 bmp] 24 bmp Vt Set:  [430 mL] 430 mL PEEP:  [12 cmH20-14 cmH20] 14 cmH20 Plateau Pressure:  [26 cmH20] 26 cmH20   Intake/Output Summary (Last 24 hours) at 09/27/2019 0751 Last data filed at 09/27/2019 0600 Gross per 24 hour  Intake 2355.76 ml  Output 2181 ml  Net 174.76 ml   Filed Weights   09/25/19 0500 09/26/19 0439 09/27/19 0417  Weight: 100.3 kg 100.8 kg 106.7 kg   Examination: General: sedated, breathing comfortably on vent  HEENT: ETT, NG in place Pulmonary: coarse breath sounds; diminished at bases. Currently on PEEP 14/FiO2 70%.   Cardiac: tachycardic, normal S1-S2 Abdomen: Soft, non-distended  Extremities: warm and well perfused; dependent edema  Neuro: Sedated on Dilaudid infusion  Resolved Hospital Problem list   Ileus  Episodes of bradycardia, resolved off of Precedex Assessment & Plan:    Acute hypoxic and hypercapneic respiratory failure due to COVID-19 Pneumonia And HCAP -intubated 09/17/19 -Continue low tidal volume ventilation; wean PEEP and FiO2 as able  -Continue VAP bundle -diurese as needed -will need tracheostomy likely early next week   Sepsis 2/2 Serratia & MRSE bacteremia and HCAP Serratia and staph aureus pneumonia -febrile this morning but overall  fever curve improving; leukocytosis stable  -has not required pressor support   -appreciate ID following  -Vanc switched to linezolid due to AKI; will continue through 10/06/19 -on  cefepime through 10-24-19 for serratia bacteremia  -TTE was neg for endocarditis AKI -likely related to antibiotic therapy which has been addressed as above -having good urine output and electrolytes are stable -continue to trend BMPs; monitor urine output   History of PE -continue LMWH in anticipation of tracheostomy  -on chronic anticoagulation at home with apixaban     Daily Goals Checklist  Pain/Anxiety/Delirium protocol (if indicated): Continue Dilaudid gtt / prn versed for RASS goal -2/ vent synchrony  VAP protocol (if indicated): yes Blood pressure target: Currently on no vasopressors. DVT prophylaxis: lovenox - full dose GI prophylaxis: Protonix BID Urinary catheter: foley for urinary retention Glucose control: SSI Mobility/therapy needs: Bedrest Code Status: Full Family Communication: will update wife Disposition: ICU  Maricela Schreur DO, DO IMTS, PGY-3 09/27/19, 8:15 am

## 2019-09-27 NOTE — Progress Notes (Signed)
CSW received call from Marlou Starks [phone]) a case manager with [employer] regarding this patient. Lynden Ang states the patient is enrolled in case management services through the federal employee assistance program. Lynden Ang stated she is available to assist with discharge planning if needed.  Edwin Dada, MSW, LCSW-A Transitions of Care   Clinical Social Worker  [facility]   Medical ICU [phone]

## 2019-09-27 NOTE — Progress Notes (Addendum)
Pharmacy Antibiotic Note  Samuel Peterson is a 54 y.o. male admitted on 09/05/2019 with SOB in setting of Covid-19.  Pharmacy has been consulted for Cefepime and Linezolid for MRSE/Serratia bacteremia.  AKI with SCr rising at 3.31, nCrCL 26 ml/min (BMI 34), but good UOP 0.8  WBC trending down at 15.9, Tm 100.5 (fever curve slow trend down)  Plan: Continue Cefepime 2gm IV every 12 hours for now but borderline for dose reduction If further rise in SCr or decrease in UOP, will adjust  End date in place for 9/19 Continue Linezolid at 600mg  IV every 12 hours - end date in place for 9/26 Monitor renal function and clinical progress  Height: 5' 9.5" (176.5 cm) Weight: 106.7 kg (235 lb 3.7 oz) IBW/kg (Calculated) : 71.85  Temp (24hrs), Avg:99.7 F (37.6 C), Min:98.8 F (37.1 C), Max:100.5 F (38.1 C)  Recent Labs  Lab 09/21/19 1238 09/21/19 1340 09/22/19 1202 09/24/19 0500 09/25/19 0433 09/26/19 0450 09/27/19 0534  WBC 17.1*  --   --  12.3* 16.5* 15.1* 15.9*  CREATININE  --    < > 2.20* 1.92* 2.31* 2.96* 3.31*   < > = values in this interval not displayed.    Estimated Creatinine Clearance: 31.3 mL/min (A) (by C-G formula based on SCr of 3.31 mg/dL (H)).    No Known Allergies  Azith 8/30>>9/2 Remdesivir 8/31>>9/4 Baricitinib 9/1>> 9/12 off d/t infxn Decadron 8/31 >> 9/9  Cefepime 9/13 >> Vanc 9/13 >>9/16  8/31 BCx - negative 9/6 MRSA PCR - negative 9/12 TA - MSSA and Enterobacter (R-Ancef) 9/12 BCx - MSSA, Staph epidermidis, and Serratia (R-Ancef)  11/12, PharmD, BCPS, BCCCP Clinical Pharmacist Please refer to [facility] for [facility] numbers 09/27/2019, 9:37 AM

## 2019-09-27 NOTE — Progress Notes (Signed)
eLink Physician-Brief Progress Note Patient Name: Samuel Peterson DOB: [birth year] MRN: [id number]   Date of Service  09/27/2019  HPI/Events of Note  Patient with sub-optimal sedation on the ventilator resulting in ventilator dyssynchrony.  eICU Interventions  Precedex infusion ordered.        Thomasene Lot Daden Mahany 09/27/2019, 9:56 PM

## 2019-09-27 NOTE — Progress Notes (Signed)
ANTICOAGULATION CONSULT NOTE - Initial Consult  Pharmacy Consult for IV Heparin Indication: History of pulmonary embolism (on Eliquis PTA)  No Known Allergies  Patient Measurements: Height: 5' 9.5" (176.5 cm) Weight: 106.7 kg (235 lb 3.7 oz) IBW/kg (Calculated) : 71.85 Heparin Dosing Weight: 95 kg  Vital Signs: Temp: 100.5 F (38.1 C) (09/17 0700) Temp Source: Axillary (09/17 0700) BP: 112/66 (09/17 1309) Pulse Rate: 104 (09/17 1309)  Labs: Recent Labs    09/25/19 0433 09/25/19 0433 09/26/19 0450 09/27/19 0534 09/27/19 1238  HGB 13.0   < > 11.6* 11.2*  --   HCT 42.9  --  38.9* 37.0*  --   PLT 279  --  276 284  --   HEPRLOWMOCWT 1.50  --   --   --  1.11  CREATININE 2.31*  --  2.96* 3.31*  --    < > = values in this interval not displayed.    Estimated Creatinine Clearance: 31.3 mL/min (A) (by C-G formula based on SCr of 3.31 mg/dL (H)).   Medical History: Past Medical History:  Diagnosis Date  . Pulmonary emboli (HCC) 2018  . Sleep apnea    Uses CPAP at night    Assessment: 54 year old male admitted with COVID and history of PE on Eliquis prior to admission was initially on Lovenox therapy while NPO. Lovenox therapy was held for tracheostomy planned for today with last dose on 9/16 at 2200 PM. Trach plan has now been changed to Monday. Orders to resume anticoagulation but switch to IV Heparin in setting of worsening renal function.   SCr is trending up at 3.31 today but still making good urine amounts (0.8 mL/kg/hr). LMWH is 1.11 this afternoon despite last dose >12 hours prior - slightly elevated above goal. Will plan to let this trend down and start IV Heparin this PM.   Goal of Therapy:  Heparin level 0.3-0.7 units/ml Monitor platelets by anticoagulation protocol: Yes   Plan:  Start IV Heparin at 1400 units/hr at 2000 PM.  Heparin level 6-8 hours after initiation. Daily Heparin level and CBC while on therapy.  Follow-up trach timing and need to hold  anticoagulation.   Link Snuffer, PharmD, BCPS, BCCCP Clinical Pharmacist Please refer to [facility] for [facility] Pharmacy numbers 09/27/2019,2:00 PM

## 2019-09-28 ENCOUNTER — Inpatient Hospital Stay (HOSPITAL_COMMUNITY): Payer: Federal, State, Local not specified - PPO

## 2019-09-28 DIAGNOSIS — U071 COVID-19: Secondary | ICD-10-CM | POA: Diagnosis not present

## 2019-09-28 DIAGNOSIS — R0602 Shortness of breath: Secondary | ICD-10-CM | POA: Diagnosis not present

## 2019-09-28 DIAGNOSIS — J90 Pleural effusion, not elsewhere classified: Secondary | ICD-10-CM | POA: Diagnosis not present

## 2019-09-28 DIAGNOSIS — K567 Ileus, unspecified: Secondary | ICD-10-CM | POA: Diagnosis not present

## 2019-09-28 DIAGNOSIS — J95851 Ventilator associated pneumonia: Secondary | ICD-10-CM | POA: Diagnosis not present

## 2019-09-28 DIAGNOSIS — K6389 Other specified diseases of intestine: Secondary | ICD-10-CM | POA: Diagnosis not present

## 2019-09-28 DIAGNOSIS — N179 Acute kidney failure, unspecified: Secondary | ICD-10-CM | POA: Diagnosis not present

## 2019-09-28 DIAGNOSIS — Z452 Encounter for adjustment and management of vascular access device: Secondary | ICD-10-CM | POA: Diagnosis not present

## 2019-09-28 DIAGNOSIS — R579 Shock, unspecified: Secondary | ICD-10-CM | POA: Diagnosis not present

## 2019-09-28 DIAGNOSIS — Z9911 Dependence on respirator [ventilator] status: Secondary | ICD-10-CM | POA: Diagnosis not present

## 2019-09-28 LAB — POCT I-STAT 7, (LYTES, BLD GAS, ICA,H+H)
Acid-base deficit: 3 mmol/L — ABNORMAL HIGH (ref 0.0–2.0)
Bicarbonate: 24.7 mmol/L (ref 20.0–28.0)
Calcium, Ion: 1.03 mmol/L — ABNORMAL LOW (ref 1.15–1.40)
HCT: 29 % — ABNORMAL LOW (ref 39.0–52.0)
Hemoglobin: 9.9 g/dL — ABNORMAL LOW (ref 13.0–17.0)
O2 Saturation: 80 %
Potassium: 3.9 mmol/L (ref 3.5–5.1)
Sodium: 135 mmol/L (ref 135–145)
TCO2: 27 mmol/L (ref 22–32)
pCO2 arterial: 58.8 mmHg — ABNORMAL HIGH (ref 32.0–48.0)
pH, Arterial: 7.232 — ABNORMAL LOW (ref 7.350–7.450)
pO2, Arterial: 54 mmHg — ABNORMAL LOW (ref 83.0–108.0)

## 2019-09-28 LAB — HEPARIN LEVEL (UNFRACTIONATED)
Heparin Unfractionated: 0.62 IU/mL (ref 0.30–0.70)
Heparin Unfractionated: 0.62 IU/mL (ref 0.30–0.70)

## 2019-09-28 LAB — RENAL FUNCTION PANEL
Albumin: 1.4 g/dL — ABNORMAL LOW (ref 3.5–5.0)
Anion gap: 12 (ref 5–15)
BUN: 155 mg/dL — ABNORMAL HIGH (ref 6–20)
CO2: 25 mmol/L (ref 22–32)
Calcium: 7.4 mg/dL — ABNORMAL LOW (ref 8.9–10.3)
Chloride: 104 mmol/L (ref 98–111)
Creatinine, Ser: 4.34 mg/dL — ABNORMAL HIGH (ref 0.61–1.24)
GFR calc Af Amer: 17 mL/min — ABNORMAL LOW (ref >60)
GFR calc non Af Amer: 15 mL/min — ABNORMAL LOW (ref >60)
Glucose, Bld: 199 mg/dL — ABNORMAL HIGH (ref 70–99)
Phosphorus: 5.6 mg/dL — ABNORMAL HIGH (ref 2.5–4.6)
Potassium: 4.3 mmol/L (ref 3.5–5.1)
Sodium: 141 mmol/L (ref 135–145)

## 2019-09-28 LAB — GLUCOSE, CAPILLARY
Glucose-Capillary: 196 mg/dL — ABNORMAL HIGH (ref 70–99)
Glucose-Capillary: 139 mg/dL — ABNORMAL HIGH (ref 70–99)
Glucose-Capillary: 180 mg/dL — ABNORMAL HIGH (ref 70–99)
Glucose-Capillary: 118 mg/dL — ABNORMAL HIGH (ref 70–99)
Glucose-Capillary: 90 mg/dL (ref 70–99)
Glucose-Capillary: 150 mg/dL — ABNORMAL HIGH (ref 70–99)

## 2019-09-28 LAB — APTT
aPTT: 52 seconds — ABNORMAL HIGH (ref 24–36)
aPTT: 50 seconds — ABNORMAL HIGH (ref 24–36)

## 2019-09-28 MED ORDER — PRISMASOL BGK 4/2.5 32-4-2.5 MEQ/L REPLACEMENT SOLN
Status: DC
  Filled 2019-09-28 (×3): qty 5000

## 2019-09-28 MED ORDER — PRISMASOL BGK 4/2.5 32-4-2.5 MEQ/L IV SOLN
INTRAVENOUS | Status: DC
  Filled 2019-09-28 (×20): qty 5000

## 2019-09-28 MED ORDER — ALBUMIN HUMAN 25 % IV SOLN
25.0000 g | Freq: Four times a day (QID) | INTRAVENOUS | Status: AC
  Administered 2019-09-28 – 2019-09-29 (×4): 25 g via INTRAVENOUS
  Filled 2019-09-28 (×4): qty 100

## 2019-09-28 MED ORDER — LINEZOLID 600 MG/300ML IV SOLN
600.0000 mg | Freq: Two times a day (BID) | INTRAVENOUS | Status: DC
  Administered 2019-09-28 – 2019-09-29 (×3): 600 mg via INTRAVENOUS
  Filled 2019-09-28 (×4): qty 300

## 2019-09-28 MED ORDER — HEPARIN (PORCINE) 2000 UNITS/L FOR CRRT
INTRAVENOUS_CENTRAL | Status: DC | PRN
  Filled 2019-09-28: qty 1000

## 2019-09-28 MED ORDER — ROCURONIUM BROMIDE 10 MG/ML (PF) SYRINGE
PREFILLED_SYRINGE | INTRAVENOUS | Status: AC
  Administered 2019-09-28: 100 mg
  Filled 2019-09-28: qty 10

## 2019-09-28 MED ORDER — ARTIFICIAL TEARS OPHTHALMIC OINT
1.0000 "application " | TOPICAL_OINTMENT | Freq: Three times a day (TID) | OPHTHALMIC | Status: DC
  Administered 2019-09-28 – 2019-09-29 (×5): 1 via OPHTHALMIC
  Filled 2019-09-28: qty 3.5

## 2019-09-28 MED ORDER — PHENYLEPHRINE HCL-NACL 10-0.9 MG/250ML-% IV SOLN
INTRAVENOUS | Status: AC
  Administered 2019-09-28: 20 ug/min via INTRAVENOUS
  Filled 2019-09-28: qty 250

## 2019-09-28 MED ORDER — PROPOFOL 1000 MG/100ML IV EMUL
25.0000 ug/kg/min | INTRAVENOUS | Status: DC
  Administered 2019-09-28 (×2): 30 ug/kg/min via INTRAVENOUS
  Administered 2019-09-28: 25 ug/kg/min via INTRAVENOUS
  Administered 2019-09-28: 35 ug/kg/min via INTRAVENOUS
  Administered 2019-09-29 (×5): 75 ug/kg/min via INTRAVENOUS
  Filled 2019-09-28 (×9): qty 100

## 2019-09-28 MED ORDER — LACTATED RINGERS IV BOLUS
1000.0000 mL | Freq: Once | INTRAVENOUS | Status: AC
  Administered 2019-09-28: 1000 mL via INTRAVENOUS

## 2019-09-28 MED ORDER — HEPARIN SODIUM (PORCINE) 1000 UNIT/ML DIALYSIS
1000.0000 [IU] | INTRAMUSCULAR | Status: DC | PRN
  Filled 2019-09-28: qty 6

## 2019-09-28 MED ORDER — HEPARIN SODIUM (PORCINE) 1000 UNIT/ML DIALYSIS
1000.0000 [IU] | INTRAMUSCULAR | Status: DC | PRN
  Administered 2019-09-28: 2500 [IU] via INTRAVENOUS_CENTRAL
  Filled 2019-09-28 (×3): qty 6

## 2019-09-28 MED ORDER — PHENYLEPHRINE HCL-NACL 10-0.9 MG/250ML-% IV SOLN
0.0000 ug/min | INTRAVENOUS | Status: DC
  Administered 2019-09-28: 40 ug/min via INTRAVENOUS
  Administered 2019-09-28: 60 ug/min via INTRAVENOUS
  Administered 2019-09-28 (×2): 40 ug/min via INTRAVENOUS
  Filled 2019-09-28 (×4): qty 250

## 2019-09-28 MED ORDER — ROCURONIUM BROMIDE 100 MG/10ML IV SOLN
60.0000 mg | INTRAVENOUS | Status: DC | PRN
  Administered 2019-09-28 – 2019-09-29 (×3): 60 mg via INTRAVENOUS
  Filled 2019-09-28 (×9): qty 6

## 2019-09-28 MED ORDER — ACETAMINOPHEN 160 MG/5ML PO SOLN: 650.0000 mg | Freq: Four times a day (QID) | ORAL | Status: DC | PRN

## 2019-09-28 MED ORDER — NEOSTIGMINE METHYLSULFATE 10 MG/10ML IV SOLN
0.2500 mg | Freq: Four times a day (QID) | INTRAVENOUS | Status: DC
  Administered 2019-09-28 (×2): 0.25 mg via SUBCUTANEOUS
  Filled 2019-09-28 (×7): qty 0.25

## 2019-09-28 MED ORDER — PHENYLEPHRINE CONCENTRATED 100MG/250ML (0.4 MG/ML) INFUSION SIMPLE
0.0000 ug/min | INTRAVENOUS | Status: DC
  Administered 2019-09-29: 200 ug/min via INTRAVENOUS
  Administered 2019-09-29: 75 ug/min via INTRAVENOUS
  Filled 2019-09-28 (×4): qty 250

## 2019-09-28 NOTE — Progress Notes (Signed)
RT NOTE: PT's head turned and arms rotated with no complications. Vitals are stable. RT will continue to monitor.

## 2019-09-28 NOTE — Progress Notes (Signed)
NAME:  Samuel Peterson, MRN:  [id number], DOB:  October 26, 1965, LOS: 18 ADMISSION DATE:  26-Sep-2019, CONSULTATION DATE:  09/14/19 REFERRING MD:  Criselda Peaches, CHIEF COMPLAINT:  COVID-19   Brief History   54yo male with PMH of PE on eliquis and recurrent respiratory infections not requiring hospitalization presenting with covid-19 pneumonia, first diagnosed 8/26, admitted with worsening shortness of breath and new oxygen requirement.  Transferred to ICU 9/6 and required intubation 9/7.   Past Medical History  PE OSA on CPAP  Significant Hospital Events   8/31> admitted 9/6> ICU transfer  9/15 sedated. Na still high 151. Renal fxn little worse. Added LOC for no BM 9/16 creatinine worse.   Consults:    Procedures:  09/17/2019 intubation>> 09/18/2019  arterial line >>  Significant Diagnostic Tests:  8/31 CXR> diffuse bilateral interstitial opacities  9/6 CTA PE >> Poor evaluation for pulmonary embolism. No central or large lobar embolism identified; Emphysema; Mosaic attenuation, favored to be related to patchy basilar predominant ground-glass  09/18/2019 chest x-ray with bilateral airspace disease crepitation 09/20/2019 CXR -bilateral interstitial airspace disease with minimal progression 09/20/2019 KUB-colonic dilatation consistent with ileus. 9/14 echocardiogram:1. There is a dynamic mid cavitary systolic gradient at rest of consistent with hyperdynamic LVF. Marland Kitchen Left ventricular ejection fraction, by estimation, is 65 to 70%. The left ventricle has normal function. The left ventricle has no regional wall motion abnormalities. There is severe left ventricular hypertrophy of the basal-septal segment. Left ventricular diastolic parameters are consistent with Grade I diastolic dysfunction (impaired relaxation). Elevated left ventricular end-diastolic pressure.  Micro Data:  8/31 Blood cultures x2 >> neg 9/6 MRSA >> neg 9/12 trach XBM:[id number] SA and mod enterobacter  9/12 BCx2 >> serratia,  MRSE, staph aureus (on biofire & cx) 9/14 blood cx> NGTD  Antimicrobials:  vanc 9/13 >> Cefepime 9/13 >> remdesivir 8/31> 9/4 baricitinib 9/1 >> 9/12 Decadron 9/1 >>9/9  Interim history/subjective:   101 fever this am. Renal function worsening  Objective   Blood pressure 107/60, pulse 76, temperature 98.2 F (36.8 C), temperature source Axillary, resp. rate (!) 25, height 5' 9.5" (1.765 m), weight 107.9 kg, SpO2 94 %.    Vent Mode: PRVC FiO2 (%):  [70 %-100 %] 90 % Set Rate:  [24 bmp] 24 bmp Vt Set:  [430 mL] 430 mL PEEP:  [14 cmH20] 14 cmH20 Plateau Pressure:  [29 cmH20-32 cmH20] 32 cmH20   Intake/Output Summary (Last 24 hours) at 09/28/2019 0703 Last data filed at 09/28/2019 1027 Gross per 24 hour  Intake 3997.4 ml  Output 1938 ml  Net 2059.4 ml   Filed Weights   09/26/19 0439 09/27/19 0417 09/28/19 0500  Weight: 100.8 kg 106.7 kg 107.9 kg   Examination: GEN: ill man on vent HEENT: NGT with small bilious output, ETT with bloody output CV: RRR, ext warm PULM: Scattered rhonci, triggering vent GI: Distended, tympanic to percussion, hypoactive BS EXT: no edema NEURO: coughing on tube, withdraws to pain PSYCH: RASS -4 SKIN: No rashes  BUN/Cr worse Oliguric CBC stable Variable sugars Sputum MSSA and enterobacter Blood 9/12 Staph epi (likely contaminant) and serratia  Resolved Hospital Problem list    Assessment & Plan:  Ileus- high NGT output recurrence - NGT to LIS pending KUB  Acute hypoxic and hypercapneic respiratory failure due to COVID-19 Pneumonia And MSSA/Enterobacter HCAP- worsened oxygenation overnight. - f/u CXR - ARDS protocol PEEP/FiO2 - Proning when P/F < 150 - Will need to consider paralytics today - Trach at some point - Precedex  and fentanyl  Sepsis 2/2 Serratia & MRSE bacteremia and HCAP Serratia and MSSA pneumonia -ID following -On linezolid and cefepime -MRSE contaminant?  AKI- worsening today, -4.5L per admission - Albumin,  LR - Hold on diuretics - Will probably need CRRT soon  History of PE - heparin gtt  Best practice:  Diet: TF Pain/Anxiety/Delirium protocol (if indicated): dilaudid, precedex, consider propofol targeting BIS VAP protocol (if indicated): in place DVT prophylaxis: heparin gtt (hx of PE) GI prophylaxis: PPI Glucose control: lantus plus SSI Mobility: BR Code Status: full Family Communication: will call Disposition: ICU pending vent liberation    The patient is critically ill with multiple organ systems failure and requires high complexity decision making for assessment and support, frequent evaluation and titration of therapies, application of advanced monitoring technologies and extensive interpretation of multiple databases. Critical Care Time devoted to patient care services described in this note independent of APP/resident time (if applicable)  is 45 minutes.   Myrla Halsted MD [facility] 09/28/2019 7:13 AM Personal pager: [phone] If unanswered, please page CCM On-call: [phone]

## 2019-09-28 NOTE — Procedures (Signed)
Central Venous Catheter Insertion Procedure Note  Samuel Peterson  [id number]  10-25-1965  Date:09/28/19  Time:11:02 AM   Provider Performing:Veneda Kirksey Carmon Ginsberg Earlene Plater   Procedure: Insertion of Non-tunneled Central Venous [phone]) with US guidance ([id number])   Indication(s) Medication administration  Consent Risks of the procedure as well as the alternatives and risks of each were explained to the patient and/or caregiver.  Consent for the procedure was obtained and is signed in the bedside chart  Anesthesia Topical only with 1% lidocaine   Timeout Verified patient identification, verified procedure, site/side was marked, verified correct patient position, special equipment/implants available, medications/allergies/relevant history reviewed, required imaging and test results available.  Sterile Technique Maximal sterile technique including full sterile barrier drape, hand hygiene, sterile gown, sterile gloves, mask, hair covering, sterile ultrasound probe cover (if used).  Procedure Description Area of catheter insertion was cleaned with chlorhexidine and draped in sterile fashion.  With real-time ultrasound guidance a central venous catheter was placed into the left internal jugular vein. Nonpulsatile blood flow and easy flushing noted in all ports.  The catheter was sutured in place and sterile dressing applied.  Complications/Tolerance None; patient tolerated the procedure well. Chest X-ray is ordered to verify placement for internal jugular or subclavian cannulation.   Chest x-ray is not ordered for femoral cannulation.  EBL Minimal  Specimen(s) None   Delfin Gant, NP-C [facility] Contact / Pager information can be found on Amion  09/28/2019, 11:03 AM

## 2019-09-28 NOTE — Progress Notes (Signed)
Pt's head turned and arms rotated with no complications. ETT secured °

## 2019-09-28 NOTE — Progress Notes (Signed)
eLink Physician-Brief Progress Note Patient Name: Samuel Peterson DOB: 1965/04/02 MRN: [id number]   Date of Service  09/28/2019  HPI/Events of Note  Patient desaturated, requiring an increase in FIO2 and PEEP.  eICU Interventions  Precedex infusion was increased for better sedation, will consider adding a paralytic if he does not stabilize soon.        Thomasene Lot [name] 09/28/2019, 3:58 AM

## 2019-09-28 NOTE — Procedures (Signed)
Central Venous Catheter Insertion Procedure Note  MCKALE HAFFEY  [id number]  [birth year]  Date:09/28/19  Time:11:00 AM   Provider Performing:Jaman Aro F Earlene Plater   Procedure: Insertion of Non-tunneled Central Venous Catheter(36556)with US guidance ([id number])    Indication(s) Hemodialysis  Consent Risks of the procedure as well as the alternatives and risks of each were explained to the patient and/or caregiver.  Consent for the procedure was obtained and is signed in the bedside chart  Anesthesia Topical only with 1% lidocaine   Timeout Verified patient identification, verified procedure, site/side was marked, verified correct patient position, special equipment/implants available, medications/allergies/relevant history reviewed, required imaging and test results available.  Sterile Technique Maximal sterile technique including full sterile barrier drape, hand hygiene, sterile gown, sterile gloves, mask, hair covering, sterile ultrasound probe cover (if used).  Procedure Description Area of catheter insertion was cleaned with chlorhexidine and draped in sterile fashion.   With real-time ultrasound guidance a HD catheter was placed into the right internal jugular vein.  Nonpulsatile blood flow and easy flushing noted in all ports.  The catheter was sutured in place and sterile dressing applied.  Complications/Tolerance None; patient tolerated the procedure well. Chest X-ray is ordered to verify placement for internal jugular or subclavian cannulation.  Chest x-ray is not ordered for femoral cannulation.  EBL Minimal  Specimen(s) None   Delfin Gant, NP-C [facility] Contact / Pager information can be found on Amion  09/28/2019, 11:01 AM

## 2019-09-28 NOTE — Progress Notes (Signed)
RT NOTE: PT placed in prone position by RT X 2 and RN X 4 with no complications. Mepilex protective pads placed on face and ET tube resecured with cloth tape. Vitals are stable. RT will continue to monitor.

## 2019-09-28 NOTE — Progress Notes (Addendum)
eLink Physician-Brief Progress Note Patient Name: Samuel Peterson DOB: 1965-05-29 MRN: [id number]   Date of Service  09/28/2019  HPI/Events of Note  Hypotension.  eICU Interventions  Phenylephrine infusion ordered via peripheral iv line. Precedex held.        Thomasene Lot Shriyans Kuenzi 09/28/2019, 5:32 AM

## 2019-09-28 NOTE — Progress Notes (Signed)
Elink notified: Patient hypotensive, T wave elevation, 600 ml out of OG.  Precedex drip stopped, Neosynephrine drip initiated. Iwatch monitoring device placed where Neo infusing to monitor for infiltration.  Patient arms and legs remain edematous.

## 2019-09-28 NOTE — Progress Notes (Signed)
ANTICOAGULATION CONSULT NOTE  Pharmacy Consult for IV Heparin Indication: History of pulmonary embolism (on Eliquis PTA)  No Known Allergies  Patient Measurements: Height: 5' 9.5" (176.5 cm) Weight: 107.9 kg (237 lb 14 oz) IBW/kg (Calculated) : 71.85 Heparin Dosing Weight: 95 kg  Vital Signs: Temp: 98.5 F (36.9 C) (09/18 2000) Temp Source: Oral (09/18 2000) BP: 97/56 (09/18 2200) Pulse Rate: 65 (09/18 2200)  Labs: Recent Labs    09/26/19 0450 09/26/19 0450 09/27/19 0534 09/27/19 1238 09/28/19 0440 09/28/19 0933 09/28/19 2137  HGB 11.6*   < > 11.2*  --  8.7*  --   --   HCT 38.9*  --  37.0*  --  28.7*  --   --   PLT 276  --  284  --  260  --   --   APTT  --   --   --   --   --  52* 50*  HEPARINUNFRC  --   --   --   --  0.62 0.62  --   HEPRLOWMOCWT  --   --   --  1.11  --   --   --   CREATININE 2.96*  --  3.31*  --  4.34*  --   --    < > = values in this interval not displayed.    Estimated Creatinine Clearance: 24 mL/min (A) (by C-G formula based on SCr of 4.34 mg/dL (H)).   Assessment: 54 year old male admitted with COVID and history of PE on Eliquis prior to admission was initially on Lovenox therapy while NPO. Lovenox therapy was held for tracheostomy planned for today with last dose on 9/16 at 2200 PM. Trach plan has now been changed to Monday. Orders to resume anticoagulation but switch to IV Heparin in setting of worsening renal function.   SCr is trending up at 4.34 today IV Heparin started PM 9/17.  aPTT 50 sec and is subtherapeutic. aPTT is not correlating with HL 0.62 - which indicates that Eliquis is likely still influencing the HL.  Will follow aPTTs until correlating.   Goal of Therapy:  Heparin level 0.3-0.7 units/ml APTT 66 - 102 secs Monitor platelets by anticoagulation protocol: Yes   Plan:  Increase IV Heparin to 1650 units/hr.  8h aPTT and heparin level Daily aPTT and Heparin level and CBC while on therapy.  Follow-up trach timing and need  to hold anticoagulation.   Thank you for involving pharmacy in this patient's care.  Loura Back, PharmD, BCPS Clinical Pharmacist 09/28/2019 10:46 PM  **Pharmacist phone directory can be found on [email or web address] listed under [facility]**

## 2019-09-28 NOTE — Progress Notes (Signed)
Called wife, updated on probable need for CRRT.  Permission obtained for HD catheter insertion and HD PRN.  She is not sure he would want it lifelong.  Myrla Halsted MD PCCM

## 2019-09-28 NOTE — Progress Notes (Signed)
ANTICOAGULATION CONSULT NOTE - Follow Up Consult  Pharmacy Consult for heparin Indication: h/o PE  Labs: Recent Labs    09/26/19 0450 09/26/19 0450 09/27/19 0534 09/27/19 1238 09/28/19 0440  HGB 11.6*   < > 11.2*  --  8.7*  HCT 38.9*  --  37.0*  --  28.7*  PLT 276  --  284  --  260  HEPARINUNFRC  --   --   --   --  0.62  HEPRLOWMOCWT  --   --   --  1.11  --   CREATININE 2.96*  --  3.31*  --  4.34*   < > = values in this interval not displayed.    Assessment/Plan:  55yo male therapeutic on heparin with initial dosing while awaiting trach. Will continue gtt at current rate and confirm stable with additional level.   Vernard Gambles, PharmD, BCPS  09/28/2019,6:02 AM

## 2019-09-28 NOTE — Consult Note (Signed)
[facility]  INPATIENT CONSULTATION  Reason for Consultation: AKI Requesting Provider: Dr. Levon Hedger  HPI: Samuel Peterson is an 54 y.o. male with h/o PE on eliquis, OSA, GERD, obesity currently hospitalized with COVID PNA and seen for AKI.   Presented 09/01/2019 with dyspnea on background of COVID+ 09/05/19 on unvaccinated status.  Transferred to ICU 9/6, intubated 9/7.  CTA 9/6 - no large embolisms.  On vanc, cefepime for bacterial PNA.  Rec'd decadron, remdesivir, baricitinib. Febrile this AM. Req FiO2 90%.  On phenylephrine this AM.   Initial Cr normal, trending up since 9/11 to 4.34 today, BUN 155.  UOP 1.5 yesterday, today oliguric with 44mL reported.  Noted Hb trending down over past 2 days 13 > 11s >8.7 this AM.  PCCM has consented family (wife) for RRT - she states not sure he'd want it longterm, consents to short term.   PMH: Past Medical History:  Diagnosis Date  . Pulmonary emboli (HCC) 2018  . Sleep apnea    Uses CPAP at night   PSH: Past Surgical History:  Procedure Laterality Date  . CARPAL TUNNEL RELEASE    . LAMINECTOMY    . NASAL SEPTUM SURGERY    . NASAL SINUS SURGERY    . NECK SURGERY       Past Medical History:  Diagnosis Date  . Pulmonary emboli (HCC) 2018  . Sleep apnea    Uses CPAP at night    Medications:  I have reviewed the patient's current medications.  Medications Prior to Admission  Medication Sig Dispense Refill  . azithromycin (ZITHROMAX) 500 MG tablet Take 500 mg by mouth daily. 3 day course    . benzonatate (TESSALON) 100 MG capsule Take 100 mg by mouth 3 (three) times daily as needed for cough.     . dexamethasone (DECADRON) 2 MG tablet Take 6 mg by mouth daily. 5 day course    . DM-Acetaminophen-Triprolidine (MUCINEX NIGHT COLD/FLU MAX STR) 10-325-1.25 MG TABS Take 2 tablets by mouth at bedtime as needed (cold and cough).    Marland Kitchen ELIQUIS 5 MG TABS tablet Take 5 mg by mouth 2 (two) times daily.    Marland Kitchen latanoprost (XALATAN)  0.005 % ophthalmic solution Place 1 drop into both eyes at bedtime.    . pantoprazole (PROTONIX) 40 MG tablet Take 40 mg by mouth 2 (two) times daily.    Marland Kitchen Phenylephrine-DM-GG-APAP (MUCINEX FREEFROM COLD/FLU DAY PO) Take 2 tablets by mouth 2 (two) times daily as needed (cough and cold).    . predniSONE (DELTASONE) 20 MG tablet Take by mouth as directed. 12 day course dosepack      ALLERGIES:  No Known Allergies  FAM HX: History reviewed. No pertinent family history.  Social History:   reports that he has quit smoking. He has never used smokeless tobacco. No history on file for alcohol use and drug use.  ROS: uanble to obtain from intubated/sedated pt  Blood pressure (!) 106/59, pulse 69, temperature 98.7 F (37.1 C), temperature source Axillary, resp. rate 18, height 5' 9.5" (1.765 m), weight 107.9 kg, SpO2 (!) 88 %. PHYSICAL EXAM: Gen: obese, intubated and sedated  Eyes: anicteric ENT: ETT in place Neck: RIJ HD catheter  CV: RRR Abd: soft, obese, NG on IWS Lungs: coarse ant GU: scant urine in foley Extr:  1+ generalized edema Neuro: sedated Skin: cool and dry   Results for orders placed or performed during the hospital encounter of 08/16/2019 (from the past 48 hour(s))  Glucose,  capillary     Status: Abnormal   Collection Time: 09/26/19 11:21 AM  Result Value Ref Range   Glucose-Capillary 174 (H) 70 - 99 mg/dL    Comment: Glucose reference range applies only to samples taken after fasting for at least 8 hours.  Glucose, capillary     Status: Abnormal   Collection Time: 09/26/19  3:14 PM  Result Value Ref Range   Glucose-Capillary 132 (H) 70 - 99 mg/dL    Comment: Glucose reference range applies only to samples taken after fasting for at least 8 hours.  Glucose, capillary     Status: Abnormal   Collection Time: 09/26/19  7:29 PM  Result Value Ref Range   Glucose-Capillary 227 (H) 70 - 99 mg/dL    Comment: Glucose reference range applies only to samples taken after fasting  for at least 8 hours.  Glucose, capillary     Status: Abnormal   Collection Time: 09/26/19 11:33 PM  Result Value Ref Range   Glucose-Capillary 177 (H) 70 - 99 mg/dL    Comment: Glucose reference range applies only to samples taken after fasting for at least 8 hours.  Glucose, capillary     Status: Abnormal   Collection Time: 09/27/19  4:09 AM  Result Value Ref Range   Glucose-Capillary 111 (H) 70 - 99 mg/dL    Comment: Glucose reference range applies only to samples taken after fasting for at least 8 hours.  CBC     Status: Abnormal   Collection Time: 09/27/19  5:34 AM  Result Value Ref Range   WBC 15.9 (H) 4.0 - 10.5 K/uL   RBC 3.52 (L) 4.22 - 5.81 MIL/uL   Hemoglobin 11.2 (L) 13.0 - 17.0 g/dL   HCT 16.137.0 (L) 39 - 52 %   MCV 105.1 (H) 80.0 - 100.0 fL   MCH 31.8 26.0 - 34.0 pg   MCHC 30.3 30.0 - 36.0 g/dL   RDW 09.612.3 04.511.5 - 40.915.5 %   Platelets 284 150 - 400 K/uL   nRBC 0.7 (H) 0.0 - 0.2 %    Comment: Performed at [facility], [address] [place], [place] [address]  Renal function panel     Status: Abnormal   Collection Time: 09/27/19  5:34 AM  Result Value Ref Range   Sodium 143 135 - 145 mmol/L   Potassium 4.4 3.5 - 5.1 mmol/L   Chloride 107 98 - 111 mmol/L   CO2 28 22 - 32 mmol/L   Glucose, Bld 122 (H) 70 - 99 mg/dL    Comment: Glucose reference range applies only to samples taken after fasting for at least 8 hours.   BUN 124 (H) 6 - 20 mg/dL   Creatinine, Ser 4.783.31 (H) 0.61 - 1.24 mg/dL   Calcium 7.7 (L) 8.9 - 10.3 mg/dL   Phosphorus 5.9 (H) 2.5 - 4.6 mg/dL   Albumin 1.7 (L) 3.5 - 5.0 g/dL   GFR calc non Af Amer 20 (L) >60 mL/min   GFR calc Af Amer 23 (L) >60 mL/min   Anion gap 8 5 - 15    Comment: Performed at [facility], [address] [place], [place] [address]  Glucose, capillary     Status: Abnormal   Collection Time: 09/27/19  7:47 AM  Result Value Ref Range   Glucose-Capillary 112 (H) 70 - 99 mg/dL    Comment: Glucose reference range applies  only to samples taken after fasting for at least 8 hours.  Low  molecular wgt heparin (fractionated)     Status: None   Collection Time: 09/27/19 12:38 PM  Result Value Ref Range   Heparin LMW 1.11 IU/mL    Comment:        THERAPEUTIC RANGE: DVT,PE,ACS on LMWH 1 mg/kg q12 at 4 hrs = 0.5-1.2 units/mL. DVT,PE on LMWH 1.5 mg/kg q24 at 4 hrs = 1-2 units/mL. Performed at [facility], [address] [place], [place] [address]   Glucose, capillary     Status: Abnormal   Collection Time: 09/27/19  1:00 PM  Result Value Ref Range   Glucose-Capillary 137 (H) 70 - 99 mg/dL    Comment: Glucose reference range applies only to samples taken after fasting for at least 8 hours.  Glucose, capillary     Status: Abnormal   Collection Time: 09/27/19  3:57 PM  Result Value Ref Range   Glucose-Capillary 118 (H) 70 - 99 mg/dL    Comment: Glucose reference range applies only to samples taken after fasting for at least 8 hours.  Glucose, capillary     Status: Abnormal   Collection Time: 09/27/19  7:16 PM  Result Value Ref Range   Glucose-Capillary 163 (H) 70 - 99 mg/dL    Comment: Glucose reference range applies only to samples taken after fasting for at least 8 hours.  Glucose, capillary     Status: Abnormal   Collection Time: 09/27/19 11:16 PM  Result Value Ref Range   Glucose-Capillary 215 (H) 70 - 99 mg/dL    Comment: Glucose reference range applies only to samples taken after fasting for at least 8 hours.  Glucose, capillary     Status: Abnormal   Collection Time: 09/28/19  3:33 AM  Result Value Ref Range   Glucose-Capillary 196 (H) 70 - 99 mg/dL    Comment: Glucose reference range applies only to samples taken after fasting for at least 8 hours.  Heparin level (unfractionated)     Status: None   Collection Time: 09/28/19  4:40 AM  Result Value Ref Range   Heparin Unfractionated 0.62 0.30 - 0.70 IU/mL    Comment: (NOTE) If heparin results are below expected values, and patient dosage  has  been confirmed, suggest follow up testing of antithrombin III levels. Performed at [facility], [address], [place], [place] [address]   CBC     Status: Abnormal   Collection Time: 09/28/19  4:40 AM  Result Value Ref Range   WBC 13.3 (H) 4.0 - 10.5 K/uL   RBC 2.66 (L) 4.22 - 5.81 MIL/uL   Hemoglobin 8.7 (L) 13.0 - 17.0 g/dL   HCT 62.2 (L) 39 - 52 %   MCV 107.9 (H) 80.0 - 100.0 fL   MCH 32.7 26.0 - 34.0 pg   MCHC 30.3 30.0 - 36.0 g/dL   RDW 29.7 98.9 - 21.1 %   Platelets 260 150 - 400 K/uL   nRBC 0.9 (H) 0.0 - 0.2 %    Comment: Performed at [facility], [address] [place], [place] [address]  Renal function panel     Status: Abnormal   Collection Time: 09/28/19  4:40 AM  Result Value Ref Range   Sodium 141 135 - 145 mmol/L   Potassium 4.3 3.5 - 5.1 mmol/L   Chloride 104 98 - 111 mmol/L   CO2 25 22 - 32 mmol/L   Glucose, Bld 199 (H) 70 - 99 mg/dL    Comment: Glucose reference range applies only  to samples taken after fasting for at least 8 hours.   BUN 155 (H) 6 - 20 mg/dL   Creatinine, Ser 2.45 (H) 0.61 - 1.24 mg/dL   Calcium 7.4 (L) 8.9 - 10.3 mg/dL   Phosphorus 5.6 (H) 2.5 - 4.6 mg/dL   Albumin 1.4 (L) 3.5 - 5.0 g/dL   GFR calc non Af Amer 15 (L) >60 mL/min   GFR calc Af Amer 17 (L) >60 mL/min   Anion gap 12 5 - 15    Comment: Performed at [facility], [address], [place], [place] [address]  Glucose, capillary     Status: Abnormal   Collection Time: 09/28/19  7:58 AM  Result Value Ref Range   Glucose-Capillary 139 (H) 70 - 99 mg/dL    Comment: Glucose reference range applies only to samples taken after fasting for at least 8 hours.    DG Chest 1 View  Result Date: 09/28/2019 CLINICAL DATA:  Ileus and shortness of breath. EXAM: CHEST  1 VIEW COMPARISON:  09/27/2019 FINDINGS: ET tube tip is above the carina. There is a enteric tube with tip below the GE junction. Stable cardiomediastinal contours. Increased volume of right pleural  effusion. Diffuse interstitial and airspace opacities are unchanged. IMPRESSION: 1. Stable support apparatus. 2. Increased right pleural effusion. 3. No change in bilateral interstitial and airspace opacities. Electronically Signed   By: Signa Kell M.D.   On: 09/28/2019 08:20   DG Chest Port 1 View  Result Date: 09/27/2019 CLINICAL DATA:  Follow-up for pneumonia.  Intubated patient. EXAM: PORTABLE CHEST 1 VIEW COMPARISON:  09/26/2019 and older studies. FINDINGS: Bilateral interstitial and airspace lung opacities are unchanged. No new lung abnormalities. No pneumothorax. Cardiac silhouette is normal in size. Endotracheal tube and nasal/orogastric tube are stable and well positioned. IMPRESSION: 1. No change from the previous day's study. 2. Persistent interstitial and hazy airspace lung opacities consistent with multifocal pneumonia. 3. Stable support apparatus. Electronically Signed   By: Amie Portland M.D.   On: 09/27/2019 13:09    Assessment/Plan **AKI:  In setting of severe COVID, shock suspect hemodynamically mediated.  Developing oliguria and uremia - initiate CRRT today with goal UF 100-151mL/hr as tol and push as needed.  4K fluids for now.    **VDRF secondary to COVID > bacterial pneumonia: broad spectrum abx and MV per PCCM and ID following.   **h/o PE: on heparin gtt, will hold in anticoag with CRRT   **Ileus noted  Tyler Pita 09/28/2019, 10:05 AM

## 2019-09-28 NOTE — Progress Notes (Signed)
ANTICOAGULATION CONSULT NOTE - Initial Consult  Pharmacy Consult for IV Heparin Indication: History of pulmonary embolism (on Eliquis PTA)  No Known Allergies  Patient Measurements: Height: 5' 9.5" (176.5 cm) Weight: 107.9 kg (237 lb 14 oz) IBW/kg (Calculated) : 71.85 Heparin Dosing Weight: 95 kg  Vital Signs: Temp: 98.7 F (37.1 C) (09/18 0800) Temp Source: Axillary (09/18 0800) BP: 94/53 (09/18 1015) Pulse Rate: 74 (09/18 1015)  Labs: Recent Labs    09/26/19 0450 09/26/19 0450 09/27/19 0534 09/27/19 1238 09/28/19 0440 09/28/19 0933  HGB 11.6*   < > 11.2*  --  8.7*  --   HCT 38.9*  --  37.0*  --  28.7*  --   PLT 276  --  284  --  260  --   APTT  --   --   --   --   --  52*  HEPARINUNFRC  --   --   --   --  0.62 0.62  HEPRLOWMOCWT  --   --   --  1.11  --   --   CREATININE 2.96*  --  3.31*  --  4.34*  --    < > = values in this interval not displayed.    Estimated Creatinine Clearance: 24 mL/min (A) (by C-G formula based on SCr of 4.34 mg/dL (H)).   Medical History: Past Medical History:  Diagnosis Date  . Pulmonary emboli (HCC) 2018  . Sleep apnea    Uses CPAP at night    Assessment: 54 year old male admitted with COVID and history of PE on Eliquis prior to admission was initially on Lovenox therapy while NPO. Lovenox therapy was held for tracheostomy planned for today with last dose on 9/16 at 2200 PM. Trach plan has now been changed to Monday. Orders to resume anticoagulation but switch to IV Heparin in setting of worsening renal function.   SCr is trending up at 4.34 today IV Heparin started PM 9/17.  APTT 52 secs is not correlating with HL 0.62 - which indicates that Eliquis is likely still influencing the HL.  Will follow aPTTs until correlating.   Goal of Therapy:  Heparin level 0.3-0.7 units/ml APTT 66 - 102 secs Monitor platelets by anticoagulation protocol: Yes   Plan:  Increase IV Heparin to 1500 units/hr.  APTT level 6-8 hours. Daily aPTT  and Heparin level and CBC while on therapy.  Follow-up trach timing and need to hold anticoagulation.   Jeanella Cara, PharmD, [facility] Clinical Pharmacist Please see AMION for all Pharmacists' Contact Phone Numbers 09/28/2019, 10:35 AM

## 2019-09-29 ENCOUNTER — Inpatient Hospital Stay (HOSPITAL_COMMUNITY): Payer: Federal, State, Local not specified - PPO

## 2019-09-29 DIAGNOSIS — J95851 Ventilator associated pneumonia: Secondary | ICD-10-CM | POA: Diagnosis not present

## 2019-09-29 DIAGNOSIS — U071 COVID-19: Secondary | ICD-10-CM | POA: Diagnosis not present

## 2019-09-29 DIAGNOSIS — Z4682 Encounter for fitting and adjustment of non-vascular catheter: Secondary | ICD-10-CM | POA: Diagnosis not present

## 2019-09-29 DIAGNOSIS — N179 Acute kidney failure, unspecified: Secondary | ICD-10-CM | POA: Diagnosis not present

## 2019-09-29 DIAGNOSIS — J1282 Pneumonia due to coronavirus disease 2019: Secondary | ICD-10-CM | POA: Diagnosis not present

## 2019-09-29 DIAGNOSIS — Z9911 Dependence on respirator [ventilator] status: Secondary | ICD-10-CM | POA: Diagnosis not present

## 2019-09-29 DIAGNOSIS — J8489 Other specified interstitial pulmonary diseases: Secondary | ICD-10-CM | POA: Diagnosis not present

## 2019-09-29 DIAGNOSIS — R579 Shock, unspecified: Secondary | ICD-10-CM | POA: Diagnosis not present

## 2019-09-29 LAB — RENAL FUNCTION PANEL
Albumin: 3 g/dL — ABNORMAL LOW (ref 3.5–5.0)
Albumin: 2.5 g/dL — ABNORMAL LOW (ref 3.5–5.0)
Anion gap: 12 (ref 5–15)
Anion gap: 12 (ref 5–15)
BUN: 103 mg/dL — ABNORMAL HIGH (ref 6–20)
BUN: 61 mg/dL — ABNORMAL HIGH (ref 6–20)
CO2: 24 mmol/L (ref 22–32)
CO2: 22 mmol/L (ref 22–32)
Calcium: 7.7 mg/dL — ABNORMAL LOW (ref 8.9–10.3)
Calcium: 7.6 mg/dL — ABNORMAL LOW (ref 8.9–10.3)
Chloride: 97 mmol/L — ABNORMAL LOW (ref 98–111)
Chloride: 101 mmol/L (ref 98–111)
Creatinine, Ser: 3.55 mg/dL — ABNORMAL HIGH (ref 0.61–1.24)
Creatinine, Ser: 2.67 mg/dL — ABNORMAL HIGH (ref 0.61–1.24)
GFR calc Af Amer: 21 mL/min — ABNORMAL LOW (ref >60)
GFR calc Af Amer: 30 mL/min — ABNORMAL LOW (ref >60)
GFR calc non Af Amer: 18 mL/min — ABNORMAL LOW (ref >60)
GFR calc non Af Amer: 26 mL/min — ABNORMAL LOW (ref >60)
Glucose, Bld: 184 mg/dL — ABNORMAL HIGH (ref 70–99)
Glucose, Bld: 196 mg/dL — ABNORMAL HIGH (ref 70–99)
Phosphorus: 7 mg/dL — ABNORMAL HIGH (ref 2.5–4.6)
Phosphorus: 4.8 mg/dL — ABNORMAL HIGH (ref 2.5–4.6)
Potassium: 4.5 mmol/L (ref 3.5–5.1)
Potassium: 4.7 mmol/L (ref 3.5–5.1)
Sodium: 133 mmol/L — ABNORMAL LOW (ref 135–145)
Sodium: 135 mmol/L (ref 135–145)

## 2019-09-29 LAB — CBC
HCT: 28.7 % — ABNORMAL LOW (ref 39.0–52.0)
HCT: 34.1 % — ABNORMAL LOW (ref 39.0–52.0)
HCT: 34.1 % — ABNORMAL LOW (ref 39.0–52.0)
Hemoglobin: 8.7 g/dL — ABNORMAL LOW (ref 13.0–17.0)
Hemoglobin: 10.7 g/dL — ABNORMAL LOW (ref 13.0–17.0)
Hemoglobin: 10.4 g/dL — ABNORMAL LOW (ref 13.0–17.0)
MCH: 32.7 pg (ref 26.0–34.0)
MCH: 33 pg (ref 26.0–34.0)
MCH: 32 pg (ref 26.0–34.0)
MCHC: 30.3 g/dL (ref 30.0–36.0)
MCHC: 31.4 g/dL (ref 30.0–36.0)
MCHC: 30.5 g/dL (ref 30.0–36.0)
MCV: 107.9 fL — ABNORMAL HIGH (ref 80.0–100.0)
MCV: 105.2 fL — ABNORMAL HIGH (ref 80.0–100.0)
MCV: 104.9 fL — ABNORMAL HIGH (ref 80.0–100.0)
Platelets: 260 10*3/uL (ref 150–400)
Platelets: 408 10*3/uL — ABNORMAL HIGH (ref 150–400)
Platelets: 366 10*3/uL (ref 150–400)
RBC: 2.66 MIL/uL — ABNORMAL LOW (ref 4.22–5.81)
RBC: 3.24 MIL/uL — ABNORMAL LOW (ref 4.22–5.81)
RBC: 3.25 MIL/uL — ABNORMAL LOW (ref 4.22–5.81)
RDW: 12.4 % (ref 11.5–15.5)
RDW: 12.9 % (ref 11.5–15.5)
RDW: 13.1 % (ref 11.5–15.5)
WBC: 13.3 10*3/uL — ABNORMAL HIGH (ref 4.0–10.5)
WBC: 28.9 10*3/uL — ABNORMAL HIGH (ref 4.0–10.5)
WBC: 26.2 10*3/uL — ABNORMAL HIGH (ref 4.0–10.5)
nRBC: 0.9 % — ABNORMAL HIGH (ref 0.0–0.2)
nRBC: 1.3 % — ABNORMAL HIGH (ref 0.0–0.2)
nRBC: 1.9 % — ABNORMAL HIGH (ref 0.0–0.2)

## 2019-09-29 LAB — CULTURE, BLOOD (ROUTINE X 2)
Culture: NO GROWTH
Culture: NO GROWTH
Special Requests: ADEQUATE
Special Requests: ADEQUATE

## 2019-09-29 LAB — BLOOD GAS, ARTERIAL
Acid-base deficit: 6 mmol/L — ABNORMAL HIGH (ref 0.0–2.0)
Bicarbonate: 24.2 mmol/L (ref 20.0–28.0)
Drawn by: 54887
FIO2: 100
O2 Saturation: 91.2 %
Patient temperature: 37
pCO2 arterial: 102 mmHg (ref 32.0–48.0)
pH, Arterial: 7.004 — CL (ref 7.350–7.450)
pO2, Arterial: 73.1 mmHg — ABNORMAL LOW (ref 83.0–108.0)

## 2019-09-29 LAB — GLUCOSE, CAPILLARY
Glucose-Capillary: 177 mg/dL — ABNORMAL HIGH (ref 70–99)
Glucose-Capillary: 186 mg/dL — ABNORMAL HIGH (ref 70–99)
Glucose-Capillary: 239 mg/dL — ABNORMAL HIGH (ref 70–99)
Glucose-Capillary: 226 mg/dL — ABNORMAL HIGH (ref 70–99)

## 2019-09-29 LAB — POCT I-STAT 7, (LYTES, BLD GAS, ICA,H+H)
Acid-base deficit: 4 mmol/L — ABNORMAL HIGH (ref 0.0–2.0)
Bicarbonate: 23.8 mmol/L (ref 20.0–28.0)
Calcium, Ion: 1.05 mmol/L — ABNORMAL LOW (ref 1.15–1.40)
HCT: 31 % — ABNORMAL LOW (ref 39.0–52.0)
Hemoglobin: 10.5 g/dL — ABNORMAL LOW (ref 13.0–17.0)
O2 Saturation: 89 %
Patient temperature: 97.2
Potassium: 4.7 mmol/L (ref 3.5–5.1)
Sodium: 135 mmol/L (ref 135–145)
TCO2: 26 mmol/L (ref 22–32)
pCO2 arterial: 54.7 mmHg — ABNORMAL HIGH (ref 32.0–48.0)
pH, Arterial: 7.243 — ABNORMAL LOW (ref 7.350–7.450)
pO2, Arterial: 65 mmHg — ABNORMAL LOW (ref 83.0–108.0)

## 2019-09-29 LAB — TRIGLYCERIDES: Triglycerides: 456 mg/dL — ABNORMAL HIGH (ref <150)

## 2019-09-29 LAB — APTT: aPTT: 79 seconds — ABNORMAL HIGH (ref 24–36)

## 2019-09-29 LAB — LACTIC ACID, PLASMA: Lactic Acid, Venous: 1.9 mmol/L (ref 0.5–1.9)

## 2019-09-29 LAB — MAGNESIUM: Magnesium: 2.7 mg/dL — ABNORMAL HIGH (ref 1.7–2.4)

## 2019-09-29 LAB — HEPARIN LEVEL (UNFRACTIONATED): Heparin Unfractionated: 0.76 IU/mL — ABNORMAL HIGH (ref 0.30–0.70)

## 2019-09-29 MED ORDER — ACETAMINOPHEN 650 MG RE SUPP: 650.0000 mg | Freq: Four times a day (QID) | RECTAL | Status: DC | PRN

## 2019-09-29 MED ORDER — NOREPINEPHRINE 4 MG/250ML-% IV SOLN
0.0000 ug/min | INTRAVENOUS | Status: DC
  Administered 2019-09-29: 2 ug/min via INTRAVENOUS
  Filled 2019-09-29: qty 250

## 2019-09-29 MED ORDER — BISACODYL 10 MG RE SUPP: 10.0000 mg | Freq: Once | RECTAL | Status: DC

## 2019-09-29 MED ORDER — SODIUM CHLORIDE 0.9% FLUSH: 10.0000 mL | INTRAVENOUS | Status: DC | PRN

## 2019-09-29 MED ORDER — MIDAZOLAM 50MG/50ML (1MG/ML) PREMIX INFUSION
0.5000 mg/h | INTRAVENOUS | Status: DC
  Administered 2019-09-29: 0.5 mg/h via INTRAVENOUS
  Filled 2019-09-29 (×2): qty 50

## 2019-09-29 MED ORDER — DEXTROSE 5 % IV SOLN: INTRAVENOUS | Status: DC

## 2019-09-29 MED ORDER — ACETAMINOPHEN 325 MG PO TABS: 650.0000 mg | ORAL_TABLET | Freq: Four times a day (QID) | ORAL | Status: DC | PRN

## 2019-09-29 MED ORDER — MORPHINE BOLUS VIA INFUSION
5.0000 mg | INTRAVENOUS | Status: DC | PRN
  Filled 2019-09-29: qty 5

## 2019-09-29 MED ORDER — MORPHINE SULFATE (PF) 2 MG/ML IV SOLN: 2.0000 mg | INTRAVENOUS | Status: DC | PRN

## 2019-09-29 MED ORDER — MORPHINE 100MG IN NS 100ML (1MG/ML) PREMIX INFUSION
0.0000 mg/h | INTRAVENOUS | Status: DC
  Filled 2019-09-29: qty 100

## 2019-09-29 MED ORDER — GLYCOPYRROLATE 0.2 MG/ML IJ SOLN: 0.2000 mg | INTRAMUSCULAR | Status: DC | PRN

## 2019-09-29 MED ORDER — POLYVINYL ALCOHOL 1.4 % OP SOLN: 1.0000 [drp] | Freq: Four times a day (QID) | OPHTHALMIC | Status: DC | PRN

## 2019-09-29 MED ORDER — SODIUM CHLORIDE 0.9% FLUSH
10.0000 mL | Freq: Two times a day (BID) | INTRAVENOUS | Status: DC
  Administered 2019-09-29 (×2): 10 mL

## 2019-09-29 MED ORDER — MIDAZOLAM HCL 2 MG/2ML IJ SOLN: 2.0000 mg | INTRAMUSCULAR | Status: DC | PRN

## 2019-09-29 MED ORDER — METOCLOPRAMIDE HCL 5 MG/ML IJ SOLN
10.0000 mg | Freq: Four times a day (QID) | INTRAMUSCULAR | Status: DC
  Administered 2019-09-29 (×2): 10 mg via INTRAVENOUS
  Filled 2019-09-29 (×2): qty 2

## 2019-09-29 MED ORDER — GLYCOPYRROLATE 1 MG PO TABS: 1.0000 mg | ORAL_TABLET | ORAL | Status: DC | PRN

## 2019-09-29 MED ORDER — HALOPERIDOL LACTATE 5 MG/ML IJ SOLN: 2.5000 mg | INTRAMUSCULAR | Status: DC | PRN

## 2019-09-29 MED ORDER — DIPHENHYDRAMINE HCL 50 MG/ML IJ SOLN: 25.0000 mg | INTRAMUSCULAR | Status: DC | PRN

## 2019-09-29 MED ORDER — VASOPRESSIN 20 UNITS/100 ML INFUSION FOR SHOCK
0.0000 [IU]/min | INTRAVENOUS | Status: DC
  Administered 2019-09-29: 0.03 [IU]/min via INTRAVENOUS
  Filled 2019-09-29: qty 100

## 2019-10-11 NOTE — Progress Notes (Signed)
[facility] Progress Note    Subjective:   Remains intubated, sedated.  I/Os 7.2 / 4.8 (UOP 0.3, NG suction 1.9, UF 2.7 CRRT).  Having oozing per RN - heparin gtt off now.  Palliative care has been consulted.  Objective Vitals:   October 01, 2019 0400 2019/10/01 0500 2019-10-01 0600 2019-10-01 0700  BP: 116/60 122/65 (!) 97/47 (!) 100/52  Pulse: 77 86 (!) 102 95  Resp: 18 20 (!) 24 (!) 24  Temp: (!) 94.5 F (34.7 C)  (!) 95.7 F (35.4 C) (!) 95.9 F (35.5 C)  TempSrc: Rectal  Axillary Axillary  SpO2: 90% (!) 89% (!) 83% (!) 88%  Weight: 110.2 kg     Height:       Physical Exam Gen: obese, intubated and sedated  Eyes: anicteric ENT: ETT in place Neck: RIJ HD catheter  CV: RRR Abd: soft, obese, NG on IWS Lungs: coarse ant GU: scant urine in foley Extr:  1+ generalized edema Neuro: sedated Skin: cool and dry  Additional Objective Labs: Basic Metabolic Panel: Recent Labs  Lab 09/27/19 0534 09/27/19 0534 09/28/19 0440 09/28/19 2249 10-01-19 0407  NA 143   < > 141 135 133*  K 4.4   < > 4.3 3.9 4.5  CL 107  --  104  --  97*  CO2 28  --  25  --  24  GLUCOSE 122*  --  199*  --  184*  BUN 124*  --  155*  --  103*  CREATININE 3.31*  --  4.34*  --  3.55*  CALCIUM 7.7*  --  7.4*  --  7.7*  PHOS 5.9*  --  5.6*  --  7.0*   < > = values in this interval not displayed.   Liver Function Tests: Recent Labs  Lab 09/27/19 0534 09/28/19 0440 01-Oct-2019 0407  ALBUMIN 1.7* 1.4* 3.0*   No results for input(s): LIPASE, AMYLASE in the last 168 hours. CBC: Recent Labs  Lab 09/25/19 0433 09/25/19 0433 09/26/19 0450 09/26/19 0450 09/27/19 0534 09/27/19 0534 09/28/19 0440 09/28/19 2249 2019-10-01 0408  WBC 16.5*   < > 15.1*   < > 15.9*  --  13.3*  --  28.9*  HGB 13.0   < > 11.6*   < > 11.2*   < > 8.7* 9.9* 10.7*  HCT 42.9   < > 38.9*   < > 37.0*   < > 28.7* 29.0* 34.1*  MCV 107.5*  --  109.3*  --  105.1*  --  107.9*  --  105.2*  PLT 279   < > 276   < > 284  --  260  --   408*   < > = values in this interval not displayed.   Blood Culture    Component Value Date/Time   SDES BLOOD LEFT HAND 09/24/2019 1510   SPECREQUEST  09/24/2019 1510    BOTTLES DRAWN AEROBIC ONLY Blood Culture adequate volume   CULT  09/24/2019 1510    NO GROWTH 4 DAYS Performed at [facility], [place] [address] [place], [place] [address]    REPTSTATUS PENDING 09/24/2019 1510    Cardiac Enzymes: No results for input(s): CKTOTAL, CKMB, CKMBINDEX, TROPONINI in the last 168 hours. CBG: Recent Labs  Lab 09/28/19 1102 09/28/19 1645 09/28/19 1958 09/28/19 2329 2019/10/01 0326  GLUCAP 180* 118* 90 150* 177*   Iron Studies: No results for input(s): IRON, TIBC, TRANSFERRIN, FERRITIN in the last 72 hours. @lablastinr3 @ Studies/Results: DG Chest  1 View  Result Date: 09/28/2019 CLINICAL DATA:  Ileus and shortness of breath. EXAM: CHEST  1 VIEW COMPARISON:  09/27/2019 FINDINGS: ET tube tip is above the carina. There is a enteric tube with tip below the GE junction. Stable cardiomediastinal contours. Increased volume of right pleural effusion. Diffuse interstitial and airspace opacities are unchanged. IMPRESSION: 1. Stable support apparatus. 2. Increased right pleural effusion. 3. No change in bilateral interstitial and airspace opacities. Electronically Signed   By: Kerby Moors M.D.   On: 09/28/2019 08:20   DG Abd 1 View  Result Date: 09/28/2019 CLINICAL DATA:  Ileus and shortness of breath. EXAM: ABDOMEN - 1 VIEW COMPARISON:  09/25/2019 FINDINGS: Nasogastric tube tip projects over the expected location of the body of the stomach. There is a feeding tube with tip in the expected location of the distal duodenum. Mild gaseous distension of the colon is again noted. Unchanged. IMPRESSION: NG and feeding tubes in good position. Stable mild gaseous distension of the colon Electronically Signed   By: Kerby Moors M.D.   On: 09/28/2019 08:24   DG CHEST PORT 1 VIEW  Result Date:  09/28/2019 CLINICAL DATA:  Central line placement. EXAM: PORTABLE CHEST 1 VIEW COMPARISON:  09/28/2019 at 7:16 a.m. FINDINGS: Nasogastric tube and adjacent enteric tube course into the stomach and off the film as tips are not visualized. Right IJ central venous catheter has tip over the SVC. Left IJ central venous catheter has tip over the SVC just above the cavoatrial junction. Endotracheal tube has tip approximately 6.2 cm above the carina. Lungs are adequately inflated demonstrate persistent hazy diffuse bilateral airspace opacification which may be due to interstitial edema versus infection. Possible small amount right pleural fluid without significant change. Cardiomediastinal silhouette and remainder of the exam is unchanged. IMPRESSION: 1. Persistent diffuse bilateral hazy airspace process which may be due to interstitial edema versus infection. Stable small amount right pleural fluid. 2. Tubes and lines as described. Electronically Signed   By: Marin Olp M.D.   On: 09/28/2019 11:35   DG Chest Port 1 View  Result Date: 09/27/2019 CLINICAL DATA:  Follow-up for pneumonia.  Intubated patient. EXAM: PORTABLE CHEST 1 VIEW COMPARISON:  09/26/2019 and older studies. FINDINGS: Bilateral interstitial and airspace lung opacities are unchanged. No new lung abnormalities. No pneumothorax. Cardiac silhouette is normal in size. Endotracheal tube and nasal/orogastric tube are stable and well positioned. IMPRESSION: 1. No change from the previous day's study. 2. Persistent interstitial and hazy airspace lung opacities consistent with multifocal pneumonia. 3. Stable support apparatus. Electronically Signed   By: Lajean Manes M.D.   On: 09/27/2019 13:09   Medications: .  prismasol BGK 4/2.5 300 mL/hr at 09/28/19 2011  .  prismasol BGK 4/2.5 300 mL/hr at 09/28/19 2011  . sodium chloride 10 mL/hr at 2019/10/25 0700  . ceFEPime (MAXIPIME) IV Stopped (2019/10/25 0124)  . dexmedetomidine (PRECEDEX) IV infusion Stopped  (09/28/19 2248)  . feeding supplement (VITAL 1.5 CAL) 65 mL/hr at 09/28/19 2000  . heparin 1,650 Units/hr (October 25, 2019 0700)  . HYDROmorphone 4 mg/hr (October 25, 2019 0700)  . linezolid (ZYVOX) IV Stopped (09/28/19 2323)  . midazolam    . phenylephrine (NEO-SYNEPHRINE) Adult infusion 75 mcg/min (10-25-2019 0700)  . prismasol BGK 4/2.5 2,000 mL/hr at 2019-10-25 0618  . propofol (DIPRIVAN) infusion 75 mcg/kg/min (2019-10-25 0700)   . artificial tears  1 application Both Eyes Q9U  . bethanechol  10 mg Per Tube TID  . bisacodyl  10 mg Rectal Once  .  chlorhexidine gluconate (MEDLINE KIT)  15 mL Mouth Rinse BID  . Chlorhexidine Gluconate Cloth  6 each Topical Daily  . docusate  100 mg Per Tube BID  . feeding supplement (PROSource TF)  45 mL Per Tube TID  . insulin aspart  0-15 Units Subcutaneous Q4H  . insulin glargine  10 Units Subcutaneous Daily  . latanoprost  1 drop Both Eyes QHS  . mouth rinse  15 mL Mouth Rinse 10 times per day  . metoCLOPramide (REGLAN) injection  10 mg Intravenous Q6H  . pantoprazole sodium  40 mg Per Tube BID  . polyethylene glycol  17 g Per Tube Daily  . senna-docusate  3 tablet Per Tube Daily  . sodium chloride flush  10-40 mL Intracatheter Q12H  . sodium zirconium cyclosilicate  10 g Per Tube Daily     Assessment/Plan **AKI:  In setting of severe COVID, shock suspect hemodynamically mediated.  Developed oliguria and uremia - initiated CRRT 9/18.  Cont goal UF 100-160m/hr as tol and push as needed.  4K fluids for now.   On systemic heparin gtt.   **VDRF secondary to COVID > bacterial pneumonia: broad spectrum abx and MV per PCCM and ID following.   **h/o PE: heparin paused for oozing, plt count normal; per Primary.  **Ileus noted  Dispo: poor prognosis = palliative care to assist in discussion  LJannifer HickMD 910/01/2019 7:29 AM  [facility] Pager: [phone]

## 2019-10-11 NOTE — Progress Notes (Signed)
NAME:  Samuel Peterson, MRN:  [id number], DOB:  [birth year], LOS: 19 ADMISSION DATE:  10/04/19, CONSULTATION DATE:  09/14/19 REFERRING MD:  Criselda Peaches, CHIEF COMPLAINT:  COVID-19   Brief History   54yo male with PMH of PE on eliquis and recurrent respiratory infections not requiring hospitalization presenting with covid-19 pneumonia, first diagnosed 8/26, admitted with worsening shortness of breath and new oxygen requirement.  Transferred to ICU 9/6 and required intubation 9/7.   Past Medical History  PE OSA on CPAP  Significant Hospital Events   8/31> admitted 9/6> ICU transfer  9/15 sedated. Na still high 151. Renal fxn little worse. Added LOC for no BM 9/18 start CRRT  Consults:    Procedures:  09/17/2019 intubation>> 09/18/2019  arterial line >> 09/28/2019 HD catheter, CVL >>  Significant Diagnostic Tests:  8/31 CXR> diffuse bilateral interstitial opacities  9/6 CTA PE >> Poor evaluation for pulmonary embolism. No central or large lobar embolism identified; Emphysema; Mosaic attenuation, favored to be related to patchy basilar predominant ground-glass  09/18/2019 chest x-ray with bilateral airspace disease crepitation 09/20/2019 CXR -bilateral interstitial airspace disease with minimal progression 09/20/2019 KUB-colonic dilatation consistent with ileus. 9/14 echocardiogram:1. There is a dynamic mid cavitary systolic gradient at rest of consistent with hyperdynamic LVF. Marland Kitchen Left ventricular ejection fraction, by estimation, is 65 to 70%. The left ventricle has normal function. The left ventricle has no regional wall motion abnormalities. There is severe left ventricular hypertrophy of the basal-septal segment. Left ventricular diastolic parameters are consistent with Grade I diastolic dysfunction (impaired relaxation). Elevated left ventricular end-diastolic pressure.  Micro Data:  8/31 Blood cultures x2 >> neg 9/6 MRSA >> neg 9/12 trach HDQ:QIWLNLGX SA and mod enterobacter    9/12 BCx2 >> serratia, MRSE, staph aureus (on biofire & cx) 9/14 blood cx> NGTD  Antimicrobials:  vanc 9/13 >> Cefepime 9/13 >> remdesivir 8/31> 9/4 baricitinib 9/1 >> 9/12 Decadron 9/1 >>9/9  Interim history/subjective:   Started CRRT WBC up Now on iNO to maintain saturations in 80s  Objective   Blood pressure (!) 97/47, pulse (!) 102, temperature (!) 95.7 F (35.4 C), temperature source Axillary, resp. rate (!) 24, height 5' 9.5" (1.765 m), weight 110.2 kg, SpO2 (!) 83 %.    Vent Mode: PCV FiO2 (%):  [90 %-100 %] 100 % Set Rate:  [24 bmp] 24 bmp Vt Set:  [430 mL] 430 mL PEEP:  [14 cmH20-16 cmH20] 16 cmH20 Plateau Pressure:  [39 cmH20-41 cmH20] 39 cmH20   Intake/Output Summary (Last 24 hours) at 10/01/2019 0654 Last data filed at 09/28/2019 0600 Gross per 24 hour  Intake 7204.08 ml  Output 4915 ml  Net 2289.08 ml   Filed Weights   09/27/19 0417 09/28/19 0500 09/28/2019 0400  Weight: 106.7 kg 107.9 kg 110.2 kg   Examination: GEN: ill man on vent HEENT: NGT with small bilious output, ETT with bloody output CV: RRR, ext warm PULM: Scattered rhonci, triggering vent GI: large volume NGT output, appears bilious EXT: no edema NEURO: coughing on tube, withdraws to pain PSYCH: RASS -4 SKIN: No rashes  BUN/Cr improved with CRRT Oliguric CBC stable Variable sugars Sputum MSSA and enterobacter Blood 9/12 Staph epi (?contaminant) and serratia  Resolved Hospital Problem list    Assessment & Plan:  Ileus- looks more colonic on KUB, now having BM but still high NGT output - trial of reglan - continue flexiseal - serial exams  Acute hypoxic and hypercapneic respiratory failure due to COVID-19 Pneumonia And MSSA/Enterobacter HCAP- worsened  oxygenation overnight. - f/u CXR - ARDS protocol PEEP/FiO2 - Proning when P/F < 150 - PRN paralytics given dys-synchrony - iNO for now, not a good long term option Today: meet with family given continued deterioration, try to pull  fluid with CRRT  Need for sedation for mechanical ventilation - Propofol raised TG again - Will try dilaudid + precedex and versed gtt if we run into issues unfortunately  Sepsis 2/2 Serratia & MRSE bacteremia and HCAP Serratia and MSSA pneumonia -ID following -On linezolid and cefepime -MRSE contaminant?  AKI- worsened, now on CRRT - Try for net negative if we can, increase pressors PRN  History of PE - heparin gtt  Clinical Trajectory/GOC- unfortunately not doing well, this was relayed to family 9/18.    Best practice:  Diet: TF Pain/Anxiety/Delirium protocol (if indicated): see above VAP protocol (if indicated): in place DVT prophylaxis: heparin gtt (hx of PE) GI prophylaxis: PPI Glucose control: lantus plus SSI Mobility: BR Code Status: full Family Communication: updated 9/18 Disposition: ICU pending vent liberation    The patient is critically ill with multiple organ systems failure and requires high complexity decision making for assessment and support, frequent evaluation and titration of therapies, application of advanced monitoring technologies and extensive interpretation of multiple databases. Critical Care Time devoted to patient care services described in this note independent of APP/resident time (if applicable)  is 45 minutes.   Myrla Halsted MD  Pulmonary Critical Care 10/06/2019 6:54 AM Personal pager: [phone] If unanswered, please page CCM On-call: #[phone]

## 2019-10-11 NOTE — Death Summary Note (Signed)
DEATH SUMMARY   Patient Details  Name: Samuel Peterson MRN: [id number] DOB: 1965-10-24  Admission/Discharge Information   Admit Date:  09-23-2019  Date of Death: Date of Death: 10/12/2019  Time of Death: Time of Death: 11-May-2139  Length of Stay: 05/12/22  Referring Physician: Richardean Chimera, MD   Reason(s) for Hospitalization  COVID 19 ARDS  Diagnoses  Preliminary cause of death:  Secondary Diagnoses (including complications and co-morbidities):  Principal Problem:   Ventilator associated pneumonia [name]) Active Problems:   COVID-19   Acute respiratory failure with hypoxia (HCC)   Staphylococcus aureus bacteremia   Serratia infection   Brief Hospital Course (including significant findings, care, treatment, and services provided and events leading to death)  Samuel Peterson is a 54 y.o. year old male who presented with COVID 19 pneumonia requiring intubation.  Hospitalization complicated by ileus, MSSA pneumonia, serratia pneumonia.  Unfortunately developed multiorgan failure 18 days into his hospitalization and family allowed him to pass in peace.   Pertinent Labs and Studies  Significant Diagnostic Studies DG Chest 1 View  Result Date: 09/28/2019 CLINICAL DATA:  Ileus and shortness of breath. EXAM: CHEST  1 VIEW COMPARISON:  09/27/2019 FINDINGS: ET tube tip is above the carina. There is a enteric tube with tip below the GE junction. Stable cardiomediastinal contours. Increased volume of right pleural effusion. Diffuse interstitial and airspace opacities are unchanged. IMPRESSION: 1. Stable support apparatus. 2. Increased right pleural effusion. 3. No change in bilateral interstitial and airspace opacities. Electronically Signed   By: Signa Kell M.D.   On: 09/28/2019 08:20   DG Abd 1 View  Result Date: 09/28/2019 CLINICAL DATA:  Ileus and shortness of breath. EXAM: ABDOMEN - 1 VIEW COMPARISON:  09/25/2019 FINDINGS: Nasogastric tube tip projects over the expected location of the body  of the stomach. There is a feeding tube with tip in the expected location of the distal duodenum. Mild gaseous distension of the colon is again noted. Unchanged. IMPRESSION: NG and feeding tubes in good position. Stable mild gaseous distension of the colon Electronically Signed   By: Signa Kell M.D.   On: 09/28/2019 08:24   DG Abd 1 View  Result Date: 09/25/2019 CLINICAL DATA:  Follow-up abdominal ileus. EXAM: ABDOMEN - 1 VIEW COMPARISON:  09/20/2019 FINDINGS: The NG tube tip is in the body region of the stomach. The feeding tube tip is in the third portion the duodenum. Scattered air, mainly in the colon. No findings for small bowel obstruction or free air. IMPRESSION: NG and feeding tubes in good position. Unremarkable bowel gas pattern. Electronically Signed   By: Rudie Meyer M.D.   On: 09/25/2019 06:11   DG Abd 1 View  Result Date: 09/20/2019 CLINICAL DATA:  Nasogastric tube placement. EXAM: ABDOMEN - 1 VIEW COMPARISON:  Chest x-ray 09/20/2019. FINDINGS: Dobbhoff tube noted with its tip projected over the midportion of the duodenum. Mild distention transverse colon again noted. No free air. Degenerative change thoracic spine. IMPRESSION: 1. Dobbhoff tube noted with tip projected over the midportion of the duodenum. 2.  Mild distension transverse colon again noted. Electronically Signed   By: Maisie Fus  Register   On: 09/20/2019 10:51   DG Abd 1 View  Result Date: 09/17/2019 CLINICAL DATA:  OG tube placement. EXAM: ABDOMEN - 1 VIEW COMPARISON:  None. FINDINGS: Tip and side port of the enteric tube below the diaphragm in the stomach. Slight gaseous distention of bowel loops in the upper abdomen. No evidence of obstruction or free  air. IMPRESSION: Tip and side port of the enteric tube below the diaphragm in the stomach. Electronically Signed   By: Narda RutherfordMelanie  Sanford M.D.   On: 09/17/2019 18:58   CT ANGIO CHEST PE W OR WO CONTRAST  Result Date: 09/16/2019 CLINICAL DATA:  Shortness of breath. COVID  positive. Evaluate for pulmonary embolism. EXAM: CT ANGIOGRAPHY CHEST WITH CONTRAST TECHNIQUE: Multidetector CT imaging of the chest was performed using the standard protocol during bolus administration of intravenous contrast. Multiplanar CT image reconstructions and MIPs were obtained to evaluate the vascular anatomy. CONTRAST:  75mL OMNIPAQUE IOHEXOL 350 MG/ML SOLN COMPARISON:  08/22/2019 plain film.  No prior CT. FINDINGS: Cardiovascular: The quality of this exam for evaluation of pulmonary embolism is poor. Mild motion degradation throughout. The bolus is poorly timed, with contrast centered in the SVC and left brachiocephalic vein. No central pulmonary embolism. No large lobar embolism. Normal aortic caliber. Mild cardiomegaly, without pericardial effusion. Mediastinum/Nodes: No mediastinal or hilar adenopathy. Lungs/Pleura: No pleural fluid. Mild centrilobular emphysema. Basilar predominant mosaic attenuation which is favored to be related to patchy ground-glass. Upper Abdomen: Probable hepatic steatosis. Normal imaged portions of the spleen, stomach, pancreas, gallbladder, adrenal glands, kidneys. Musculoskeletal: Cervical spine fixation. Review of the MIP images confirms the above findings. IMPRESSION: 1. Poor evaluation for pulmonary embolism. No central or large lobar embolism identified. 2.  Emphysema (ICD10-J43.9). 3. Mosaic attenuation, favored to be related to patchy basilar predominant ground-glass which given the clinical history of COVID-19 likely represents multifocal pneumonia. Electronically Signed   By: Jeronimo GreavesKyle  Talbot M.D.   On: 09/16/2019 18:34   DG CHEST PORT 1 VIEW  Result Date: 08-02-2019 CLINICAL DATA:  54 year old male COVID-19.  Intubated. EXAM: PORTABLE CHEST 1 VIEW COMPARISON:  Portable chest 09/28/2019 and earlier. FINDINGS: Portable AP semi upright view at 1652 hours. Endotracheal tube tip is in good position between the level the clavicles and carina. Stable left IJ single-lumen  and double lumen appearing right IJ catheters. Enteric tube courses to the left abdomen, tip not included. Multiple additional new external appearing leads and wires also project over the chest. Mildly lower lung volumes. Mediastinal contours remain normal. Coarse bilateral pulmonary interstitial opacity with some perihilar air bronchograms has not significantly changed. No pneumothorax or pleural effusion identified. IMPRESSION: 1. Satisfactory lines and tubes. 2. No significant change in bilateral COVID-19 pneumonia. Electronically Signed   By: Odessa FlemingH  Hall M.D.   On: 007-23-2021 17:01   DG CHEST PORT 1 VIEW  Result Date: 09/28/2019 CLINICAL DATA:  Central line placement. EXAM: PORTABLE CHEST 1 VIEW COMPARISON:  09/28/2019 at 7:16 a.m. FINDINGS: Nasogastric tube and adjacent enteric tube course into the stomach and off the film as tips are not visualized. Right IJ central venous catheter has tip over the SVC. Left IJ central venous catheter has tip over the SVC just above the cavoatrial junction. Endotracheal tube has tip approximately 6.2 cm above the carina. Lungs are adequately inflated demonstrate persistent hazy diffuse bilateral airspace opacification which may be due to interstitial edema versus infection. Possible small amount right pleural fluid without significant change. Cardiomediastinal silhouette and remainder of the exam is unchanged. IMPRESSION: 1. Persistent diffuse bilateral hazy airspace process which may be due to interstitial edema versus infection. Stable small amount right pleural fluid. 2. Tubes and lines as described. Electronically Signed   By: Elberta Fortisaniel  Boyle M.D.   On: 09/28/2019 11:35   DG Chest Port 1 View  Result Date: 09/27/2019 CLINICAL DATA:  Follow-up for pneumonia.  Intubated  patient. EXAM: PORTABLE CHEST 1 VIEW COMPARISON:  09/26/2019 and older studies. FINDINGS: Bilateral interstitial and airspace lung opacities are unchanged. No new lung abnormalities. No pneumothorax.  Cardiac silhouette is normal in size. Endotracheal tube and nasal/orogastric tube are stable and well positioned. IMPRESSION: 1. No change from the previous day's study. 2. Persistent interstitial and hazy airspace lung opacities consistent with multifocal pneumonia. 3. Stable support apparatus. Electronically Signed   By: Amie Portland M.D.   On: 09/27/2019 13:09   DG Chest Port 1 View  Result Date: 09/26/2019 CLINICAL DATA:  Pneumonia EXAM: PORTABLE CHEST 1 VIEW COMPARISON:  Two days ago FINDINGS: Endotracheal tube with tip between the clavicular heads and carina. The enteric tube tip reaches the stomach which appears gas distended. There is also a feeding tube which continues at least to the distal stomach. Interstitial and airspace opacity on the right more than left. No effusion or air leak is seen. Normal heart size. IMPRESSION: 1. Stable pulmonary infiltrates. 2. The orogastric tube tip is at the proximal stomach, which is gas distended. Electronically Signed   By: Marnee Spring M.D.   On: 09/26/2019 07:50   DG Chest Port 1 View  Result Date: 09/24/2019 CLINICAL DATA:  Respiratory failure EXAM: PORTABLE CHEST 1 VIEW COMPARISON:  09/21/2019, 09/20/2019 FINDINGS: Endotracheal tube tip is about 2.6 cm superior to the carina. Esophageal tube tip below the diaphragm but incompletely visualized. Extensive airspace disease on the right has worsened compared to prior. Slight increased left basilar pulmonary airspace disease. Stable cardiomediastinal silhouette. No pneumothorax. Surgical hardware in the cervical spine. IMPRESSION: Worsening bilateral airspace disease, right greater than left. Positioning of support lines and tubes as above. Electronically Signed   By: Jasmine Pang M.D.   On: 09/24/2019 03:48   DG Chest Port 1 View  Result Date: 09/21/2019 CLINICAL DATA:  Low oxygen saturation, COVID positive, evaluate tube placement EXAM: PORTABLE CHEST 1 VIEW COMPARISON:  Radiograph 09/20/2019  FINDINGS: Endotracheal tube in the mid trachea, 5.1 cm from the level of the carina. A transesophageal sump tube tip terminates below the level of imaging with the side port at the GE junction. Consider advancing at least 3 cm for optimal functioning. Additional transesophageal feeding tube is noted as well with the tip below the GE junction, and beyond the margins of imaging. Telemetry leads overlie the chest. Heterogeneous bilateral mixed interstitial and airspace opacities in both lungs more pronounced in the left mid to lower lung are similar to prior. New bandlike opacities in the right lung base likely reflecting some developing subsegmental atelectasis. No pneumothorax. No visible effusion. Cardiomediastinal contours are similar to prior counting for differences in technique and diminishing lung volumes. No acute osseous or soft tissue abnormality. Degenerative changes are present in the imaged spine and shoulders. Remote cervical fusion again noted. IMPRESSION: 1. Endotracheal tube in the mid trachea, 5.1 cm from the level of the carina. 2. A transesophageal sump tube tip terminates below the level of imaging with the side port at the GE junction. Consider advancing at least 3 cm for optimal functioning. 3. Transesophageal feeding tube tip below the GE junction and beyond the margins of imaging. 4. Stable heterogeneous bilateral opacities. 5. New bandlike opacities in the right lung base likely reflecting some developing subsegmental atelectasis. Electronically Signed   By: Kreg Shropshire M.D.   On: 09/21/2019 19:23   DG CHEST PORT 1 VIEW  Result Date: 09/20/2019 CLINICAL DATA:  Shortness of breath.  COVID positive EXAM: PORTABLE CHEST  1 VIEW COMPARISON:  09/19/2019 FINDINGS: Endotracheal tube and enteric tube are unchanged in position. Cardiac enlargement. Diffuse bilateral airspace and interstitial infiltrates, unchanged. Appearances are compatible with COVID pneumonia or edema. No pleural effusions. No  pneumothorax. IMPRESSION: Cardiac enlargement with diffuse bilateral airspace and interstitial infiltrates, unchanged. Electronically Signed   By: Burman Nieves M.D.   On: 09/20/2019 05:21   DG Chest Port 1 View  Result Date: 09/19/2019 CLINICAL DATA:  Hypoxia, COVID-19 positive. EXAM: PORTABLE CHEST 1 VIEW COMPARISON:  September 18, 2019. FINDINGS: The heart size and mediastinal contours are within normal limits. Endotracheal and nasogastric tubes are unchanged in position. No pneumothorax or pleural effusion is noted. Stable diffuse interstitial densities are noted throughout both lungs concerning for atypical infection. The visualized skeletal structures are unremarkable. IMPRESSION: Stable support apparatus. Stable diffuse interstitial densities are noted throughout both lungs concerning for atypical infection. Electronically Signed   By: Lupita Raider M.D.   On: 09/19/2019 09:38   DG Chest Port 1 View  Result Date: 09/18/2019 CLINICAL DATA:  Acute respiratory failure due to COVID-19 EXAM: PORTABLE CHEST 1 VIEW COMPARISON:  09/17/2019 FINDINGS: Endotracheal tube tip approximately 3.3 cm above the carina. Gastric tube courses below the diaphragm with the side port below the GE junction and the tip projecting at the stomach. Slightly improved aeration lungs with slightly improved left greater than right interstitial and airspace opacities. No visible pleural effusions or pneumothorax. IMPRESSION: 1. Endotracheal tube tip approximately 3.3 cm above the carina. 2. Slightly improved diffuse interstitial airspace opacities, consistent with multifocal pneumonia in this patient with known COVID infection. Electronically Signed   By: Feliberto Harts MD   On: 09/18/2019 07:09   DG CHEST PORT 1 VIEW  Result Date: 09/17/2019 CLINICAL DATA:  Intubation. Acute respiratory failure due to COVID-19. EXAM: PORTABLE CHEST 1 VIEW COMPARISON:  Chest CT yesterday. FINDINGS: The endotracheal tube tip is 3.5 cm from the  carina. Enteric tube in place with tip below the diaphragm better assessed on concurrent abdominal exam. Stable heart size and mediastinal contours. Diffuse interstitial opacities with slight progression from prior radiograph grossly stable from CT yesterday, consistent with history of COVID-19 pneumonia. No pneumothorax or evidence of pneumomediastinum. No large pleural effusion. No acute osseous abnormalities are seen. IMPRESSION: 1. Endotracheal tube tip 3.5 cm from the carina. 2. Enteric tube in place with tip below the diaphragm better assessed on concurrent abdominal exam. 3. Diffuse interstitial opacities consistent with COVID-19 pneumonia. Electronically Signed   By: Narda Rutherford M.D.   On: 09/17/2019 18:58   DG Chest Port 1 View  Result Date: 08/27/2019 CLINICAL DATA:  COVID, shortness of breath EXAM: PORTABLE CHEST 1 VIEW COMPARISON:  None. FINDINGS: The heart size and mediastinal contours are within normal limits. Diffuse bilateral interstitial and heterogeneous airspace opacity. The visualized skeletal structures are unremarkable. IMPRESSION: Diffuse bilateral interstitial and heterogeneous airspace opacity, consistent with COVID-19 airspace disease. Electronically Signed   By: Lauralyn Primes M.D.   On: 09/06/2019 11:06   DG Abd Portable 1V  Result Date: 09/19/2019 CLINICAL DATA:  Ileus.  Abdominal distension. EXAM: PORTABLE ABDOMEN - 1 VIEW COMPARISON:  Radiograph 09/17/2019 FINDINGS: Tip of the enteric tube below the diaphragm. Left abdomen not included in the field of view. Gaseous distension of transverse colon with formed stool in the right colon. No obvious small bowel dilatation. No evidence of obstruction. No abnormal rectal distention IMPRESSION: Mild gaseous distension of transverse colon, suggestive of colonic ileus. No small bowel distension  or evidence of obstruction. Electronically Signed   By: Narda Rutherford M.D.   On: 09/19/2019 23:57   ECHOCARDIOGRAM LIMITED  Result Date:  09/24/2019    ECHOCARDIOGRAM LIMITED REPORT   Patient Name:   KLINTON CANDELAS [facility] Date of Exam: 09/24/2019 Medical Rec #:  [id number]        Height:       69.5 in Accession #:    [id number]       Weight:       222.2 lb Date of Birth:  Jan 12, 1965        BSA:          2.171 m Patient Age:    54 years         BP:           115/76 mmHg Patient Gender: M                HR:           115 bpm. Exam Location:  Inpatient Procedure: 2D Echo, Cardiac Doppler and Color Doppler Indications:    Bacteremia  History:        Patient has no prior history of Echocardiogram examinations.                 Risk Factors:Former Smoker and Sleep Apnea. COVID-19. H/O                 pulmonary emboli.  Sonographer:    Ross Ludwig RDCS (AE) Referring Phys: [id number] Steffanie Dunn  Sonographer Comments: Echo performed with patient supine and on artificial respirator. IMPRESSIONS  1. There is a dynamic mid cavitary systolic gradient at rest of consistent with hyperdynamic LVF. Marland Kitchen Left ventricular ejection fraction, by estimation, is 65 to 70%. The left ventricle has normal function. The left ventricle has no regional wall motion abnormalities. There is severe left ventricular hypertrophy of the basal-septal segment. Left ventricular diastolic parameters are consistent with Grade I diastolic dysfunction (impaired relaxation). Elevated left ventricular end-diastolic pressure.  2. Right ventricular systolic function is normal. The right ventricular size is normal. Tricuspid regurgitation signal is inadequate for assessing PA pressure.  3. The mitral valve is normal in structure. No evidence of mitral valve regurgitation. No evidence of mitral stenosis.  4. The aortic valve was not well visualized. Aortic valve regurgitation is not visualized.  5. The inferior vena cava is normal in size with <50% respiratory variability, suggesting right atrial pressure of 8 mmHg. FINDINGS  Left Ventricle: There is a dynamic mid cavitary systolic gradient at rest of  145mmHg consistent with hyperdynamic LVF. Left ventricular ejection fraction, by estimation, is 65 to 70%. The left ventricle has normal function. The left ventricle has no regional wall motion abnormalities. The left ventricular internal cavity size was normal in size. There is severe left ventricular hypertrophy of the basal-septal segment. Left ventricular diastolic parameters are consistent with Grade I diastolic dysfunction (impaired relaxation). Elevated left ventricular end-diastolic pressure. Right Ventricle: The right ventricular size is normal. No increase in right ventricular wall thickness. Right ventricular systolic function is normal. Tricuspid regurgitation signal is inadequate for assessing PA pressure. Left Atrium: Left atrial size was normal in size. Right Atrium: Right atrial size was normal in size. Pericardium: There is no evidence of pericardial effusion. Mitral Valve: The mitral valve is normal in structure. No evidence of mitral valve stenosis. MV peak gradient, 8.1 mmHg. The mean mitral valve gradient is 4.0 mmHg. Tricuspid Valve: The tricuspid valve is  normal in structure. Tricuspid valve regurgitation is not demonstrated. No evidence of tricuspid stenosis. Aortic Valve: The aortic valve was not well visualized. Aortic valve regurgitation is not visualized. Pulmonic Valve: The pulmonic valve was normal in structure. Pulmonic valve regurgitation is not visualized. No evidence of pulmonic stenosis. Aorta: The aortic root is normal in size and structure. Venous: The inferior vena cava is normal in size with less than 50% respiratory variability, suggesting right atrial pressure of 8 mmHg. IAS/Shunts: No atrial level shunt detected by color flow Doppler. LEFT VENTRICLE PLAX 2D LVIDd:         3.20 cm  Diastology LVIDs:         2.20 cm  LV e' medial:    6.74 cm/s LV PW:         1.60 cm  LV E/e' medial:  13.6 LV IVS:        1.60 cm  LV e' lateral:   7.40 cm/s LVOT diam:     2.20 cm  LV E/e'  lateral: 12.4 LVOT Area:     3.80 cm  IVC IVC diam: 1.90 cm LEFT ATRIUM         Index LA diam:    3.00 cm 1.38 cm/m   AORTA Ao Root diam: 3.40 cm MITRAL VALVE MV Area (PHT): 3.72 cm     SHUNTS MV Peak grad:  8.1 mmHg     Systemic Diam: 2.20 cm MV Mean grad:  4.0 mmHg MV Vmax:       1.42 m/s MV Vmean:      89.9 cm/s MV Decel Time: 204 msec MV E velocity: 91.60 cm/s MV A velocity: 103.00 cm/s MV E/A ratio:  0.89 Armanda Magic MD Electronically signed by Armanda Magic MD Signature Date/Time: 09/24/2019/5:05:03 PM    Final     Microbiology Recent Results (from the past 240 hour(s))  Culture, blood (routine x 2)     Status: None   Collection Time: 09/24/19  3:05 PM   Specimen: BLOOD RIGHT HAND  Result Value Ref Range Status   Specimen Description BLOOD RIGHT HAND  Final   Special Requests   Final    BOTTLES DRAWN AEROBIC ONLY Blood Culture adequate volume   Culture   Final    NO GROWTH 5 DAYS Performed at [facility], [address], [place], [place] [address]    Report Status 09/26/2019 FINAL  Final  Culture, blood (routine x 2)     Status: None   Collection Time: 09/24/19  3:10 PM   Specimen: BLOOD LEFT HAND  Result Value Ref Range Status   Specimen Description BLOOD LEFT HAND  Final   Special Requests   Final    BOTTLES DRAWN AEROBIC ONLY Blood Culture adequate volume   Culture   Final    NO GROWTH 5 DAYS Performed at [facility], [address] [place], [place] [address]    Report Status 09/11/2019 FINAL  Final    Lab Basic Metabolic Panel: Recent Labs  Lab 09/27/19 0534 09/27/19 0534 09/28/19 0440 09/28/19 2249 10/10/2019 0407 10/03/2019 1641 10/01/2019 1653  NA 143   < > 141 135 133* 135 135  K 4.4   < > 4.3 3.9 4.5 4.7 4.7  CL 107  --  104  --  97* 101  --   CO2 28  --  25  --  24 22  --   GLUCOSE 122*  --  199*  --  184* 196*  --  BUN 124*  --  155*  --  103* 61*  --   CREATININE 3.31*  --  4.34*  --  3.55* 2.67*  --   CALCIUM 7.7*  --  7.4*  --  7.7*  7.6*  --   MG  --   --   --   --  2.7*  --   --   PHOS 5.9*  --  5.6*  --  7.0* 4.8*  --    < > = values in this interval not displayed.   Liver Function Tests: Recent Labs  Lab 09/27/19 0534 09/28/19 0440 2019/10/11 0407 10-11-2019 1641  ALBUMIN 1.7* 1.4* 3.0* 2.5*   No results for input(s): LIPASE, AMYLASE in the last 168 hours. No results for input(s): AMMONIA in the last 168 hours. CBC: Recent Labs  Lab 09/27/19 0534 09/27/19 0534 09/28/19 0440 09/28/19 2249 2019/10/11 0408 October 11, 2019 0820 10/11/2019 1653  WBC 15.9*  --  13.3*  --  28.9* 26.2*  --   HGB 11.2*   < > 8.7* 9.9* 10.7* 10.4* 10.5*  HCT 37.0*   < > 28.7* 29.0* 34.1* 34.1* 31.0*  MCV 105.1*  --  107.9*  --  105.2* 104.9*  --   PLT 284  --  260  --  408* 366  --    < > = values in this interval not displayed.   Cardiac Enzymes: No results for input(s): CKTOTAL, CKMB, CKMBINDEX, TROPONINI in the last 168 hours. Sepsis Labs: Recent Labs  Lab 09/27/19 0534 09/28/19 0440 2019-10-11 0408 10-11-2019 0820  WBC 15.9* 13.3* 28.9* 26.2*  LATICACIDVEN  --   --   --  1.9     Lorin Glass 10/03/2019, 2:42 PM

## 2019-10-11 NOTE — Procedures (Signed)
Extubation Procedure Note  Patient Details:   Name: Samuel Peterson DOB: May 25, 1965 MRN: [id number]   Airway Documentation:    Vent end date: October 02, 2019 Vent end time: 2135   Pt terminally extubated to per Md order    [name] 10/02/2019, 9:43 PM

## 2019-10-11 NOTE — Progress Notes (Signed)
Pt's head turned and arms rotated with no complications. ETT secured °

## 2019-10-11 NOTE — Progress Notes (Signed)
Wasted 63mL of dilaudid in sink.  Witnessed by Dustin Flock RN.

## 2019-10-11 NOTE — Significant Event (Signed)
Called to bedside emergently with escalating pressor requirement. CXR w/ PTX. Hgb stable. Blood gas with improved pH 7.25. On broad spectrum abx. Suspect developing refractory shock in setting of active dying. Shared with wife. She states he would not want to live on machines. Discussed and she agreed with DNR. Discussed comfort care and she feels this is most in line with patient GOC. Family called in to visit. Plan to withdraw care tonight. Comfort measures ordered.

## 2019-10-11 NOTE — Progress Notes (Signed)
Met with wife to discuss gravity of patient condition and low likelihood of meaningful survival. She is still processing but agreeable to speak with palliative care to establish consistent communication regarding patient condition and what patient would want if things took turn for worse.  Erskine Emery MD PCCM

## 2019-10-11 NOTE — Progress Notes (Signed)
eLink Physician-Brief Progress Note Patient Name: Samuel Peterson DOB: [birth year] MRN: [id number]   Date of Service  10/07/19  HPI/Events of Note  Na+ = 141 --> 135.  eICU Interventions  Plan: 1. D/C Free water flushes.     Intervention Category Major Interventions: Electrolyte abnormality - evaluation and management  Dorlene Footman [place] Oct 07, 2019, 12:46 AM

## 2019-10-11 NOTE — Progress Notes (Signed)
ANTICOAGULATION CONSULT NOTE  Pharmacy Consult for IV Heparin Indication: History of pulmonary embolism (on Eliquis PTA)  No Known Allergies  Patient Measurements: Height: 5' 9.5" (176.5 cm) Weight: 110.2 kg (242 lb 15.2 oz) IBW/kg (Calculated) : 71.85 Heparin Dosing Weight: 95 kg  Vital Signs: Temp: 95.9 F (35.5 C) (09/19 0700) Temp Source: Axillary (09/19 0700) BP: 109/62 (09/19 0900) Pulse Rate: 114 (09/19 0900)  Labs: Recent Labs    09/27/19 0534 09/27/19 0534 09/27/19 1238 09/28/19 0440 09/28/19 0440 09/28/19 0933 09/28/19 2137 09/28/19 2249 09/28/19 2249 October 03, 2019 0407 2019/10/03 0408 10/03/19 0820  HGB 11.2*   < >  --  8.7*   < >  --   --  9.9*   < >  --  10.7* 10.4*  HCT 37.0*   < >  --  28.7*   < >  --   --  29.0*  --   --  34.1* 34.1*  PLT 284   < >  --  260  --   --   --   --   --   --  408* 366  APTT  --   --   --   --   --  52* 50*  --   --   --   --  79*  HEPARINUNFRC  --   --   --  0.62  --  0.62  --   --   --   --   --  0.76*  HEPRLOWMOCWT  --   --  1.11  --   --   --   --   --   --   --   --   --   CREATININE 3.31*  --   --  4.34*  --   --   --   --   --  3.55*  --   --    < > = values in this interval not displayed.    Estimated Creatinine Clearance: 29.7 mL/min (A) (by C-G formula based on SCr of 3.55 mg/dL (H)).   Assessment: 54 year old male admitted with COVID and history of PE on Eliquis prior to admission was initially on Lovenox therapy while NPO. Lovenox therapy was held for tracheostomy planned for today with last dose on 9/16 at 2200 PM. Trach plan has now been changed to Monday. Orders to resume anticoagulation but switch to IV Heparin in setting of worsening renal function.   SCr is 3.55. IV Heparin started PM 9/17.  aPTT 79 sec is therapeutic. aPTT is not correlating with HL 0.76 - which indicates that Eliquis is likely still influencing the HL.  Will follow aPTTs until correlating.   Goal of Therapy:  Heparin level 0.3-0.7  units/ml APTT 66 - 102 secs Monitor platelets by anticoagulation protocol: Yes   Plan:  Continue IV Heparin at 1650 units/hr.  8h aPTT  Daily aPTT and Heparin level and CBC while on therapy.  Follow-up trach timing and need to hold anticoagulation.   Thank you for involving pharmacy in this patient's care.  Jeanella Cara, PharmD, [employer] [facility] Clinical Pharmacist Please see AMION for all Pharmacists' Contact Phone Numbers 03-Oct-2019, 9:54 AM

## 2019-10-11 NOTE — Progress Notes (Signed)
RT NOTE: Patient placed supine by RT X 1 by RN X 4 with no complications. Face cleaned and no breakdown noted at this time. ETT resecured with commercial grade tube holder. ABG to be done in 2 hours. Vitals are stable. RT will continue to monitor.

## 2019-10-11 NOTE — Progress Notes (Signed)
Pt expired on 09/14/2019 at 2141.  Pt extubated per comfort care orders and DNR in place at time of death.  Spouse and family present at bedside at time of death. No lung sounds auscultated in all lung fields and no carotid pulses palpated over a period of one minute.  Pronounced by this RN and Dustin Flock RN.  Referral to CDS made and pt is not a candidate for donation.

## 2019-10-11 DEATH — deceased

## 2021-10-09 IMAGING — DX DG CHEST 1V PORT
1 series · 1 of 1 positions shown · non-contrast
Comparison: None.

CLINICAL DATA: COVID, shortness of breath

EXAM:
PORTABLE CHEST 1 VIEW

[chest]
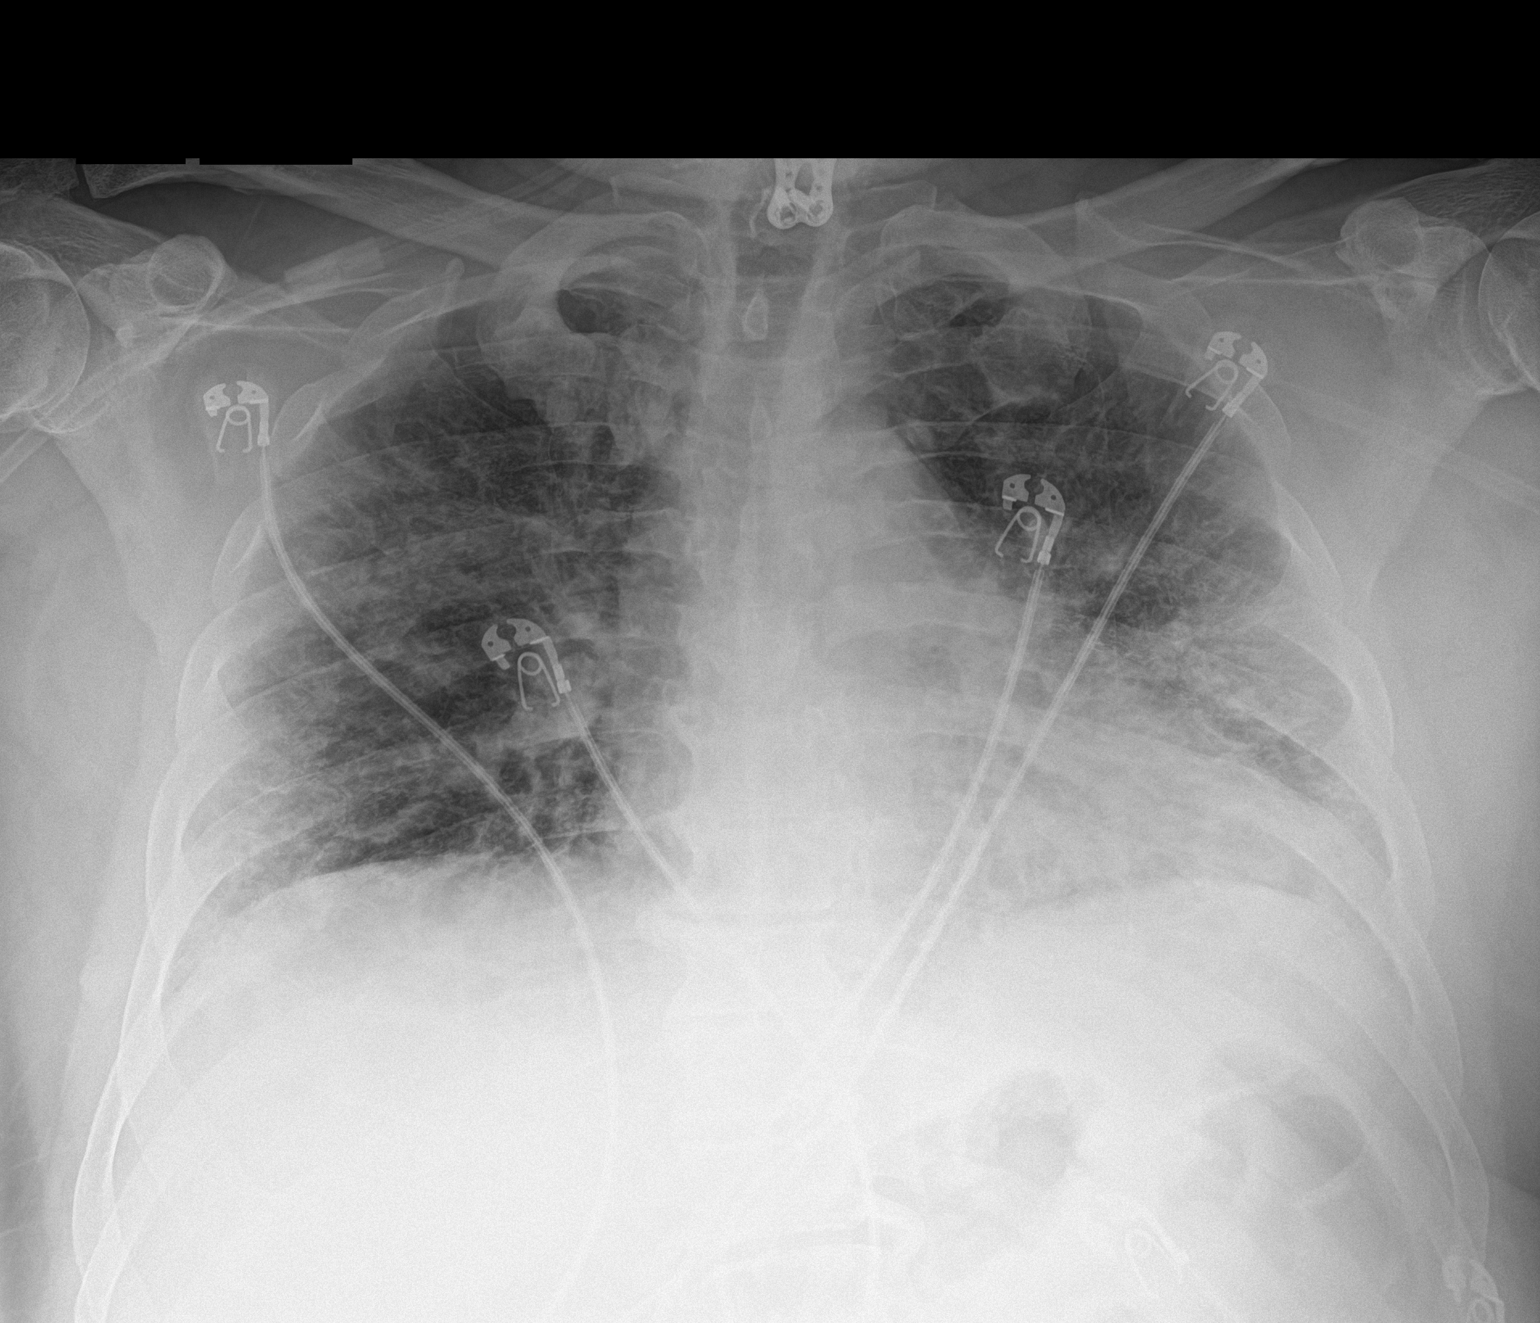

[1 of 1 positions shown; findings below may reference images not displayed]

FINDINGS: The heart size and mediastinal contours are within normal limits.
Diffuse bilateral interstitial and heterogeneous airspace opacity.
The visualized skeletal structures are unremarkable.
IMPRESSION: Diffuse bilateral interstitial and heterogeneous airspace opacity,
consistent with [id number] airspace disease.

## 2021-10-16 IMAGING — DX DG ABDOMEN 1V
1 series · 1 of 1 positions shown · non-contrast
Comparison: None.

CLINICAL DATA: OG tube placement.

EXAM:
ABDOMEN - 1 VIEW

[abdomen kub]
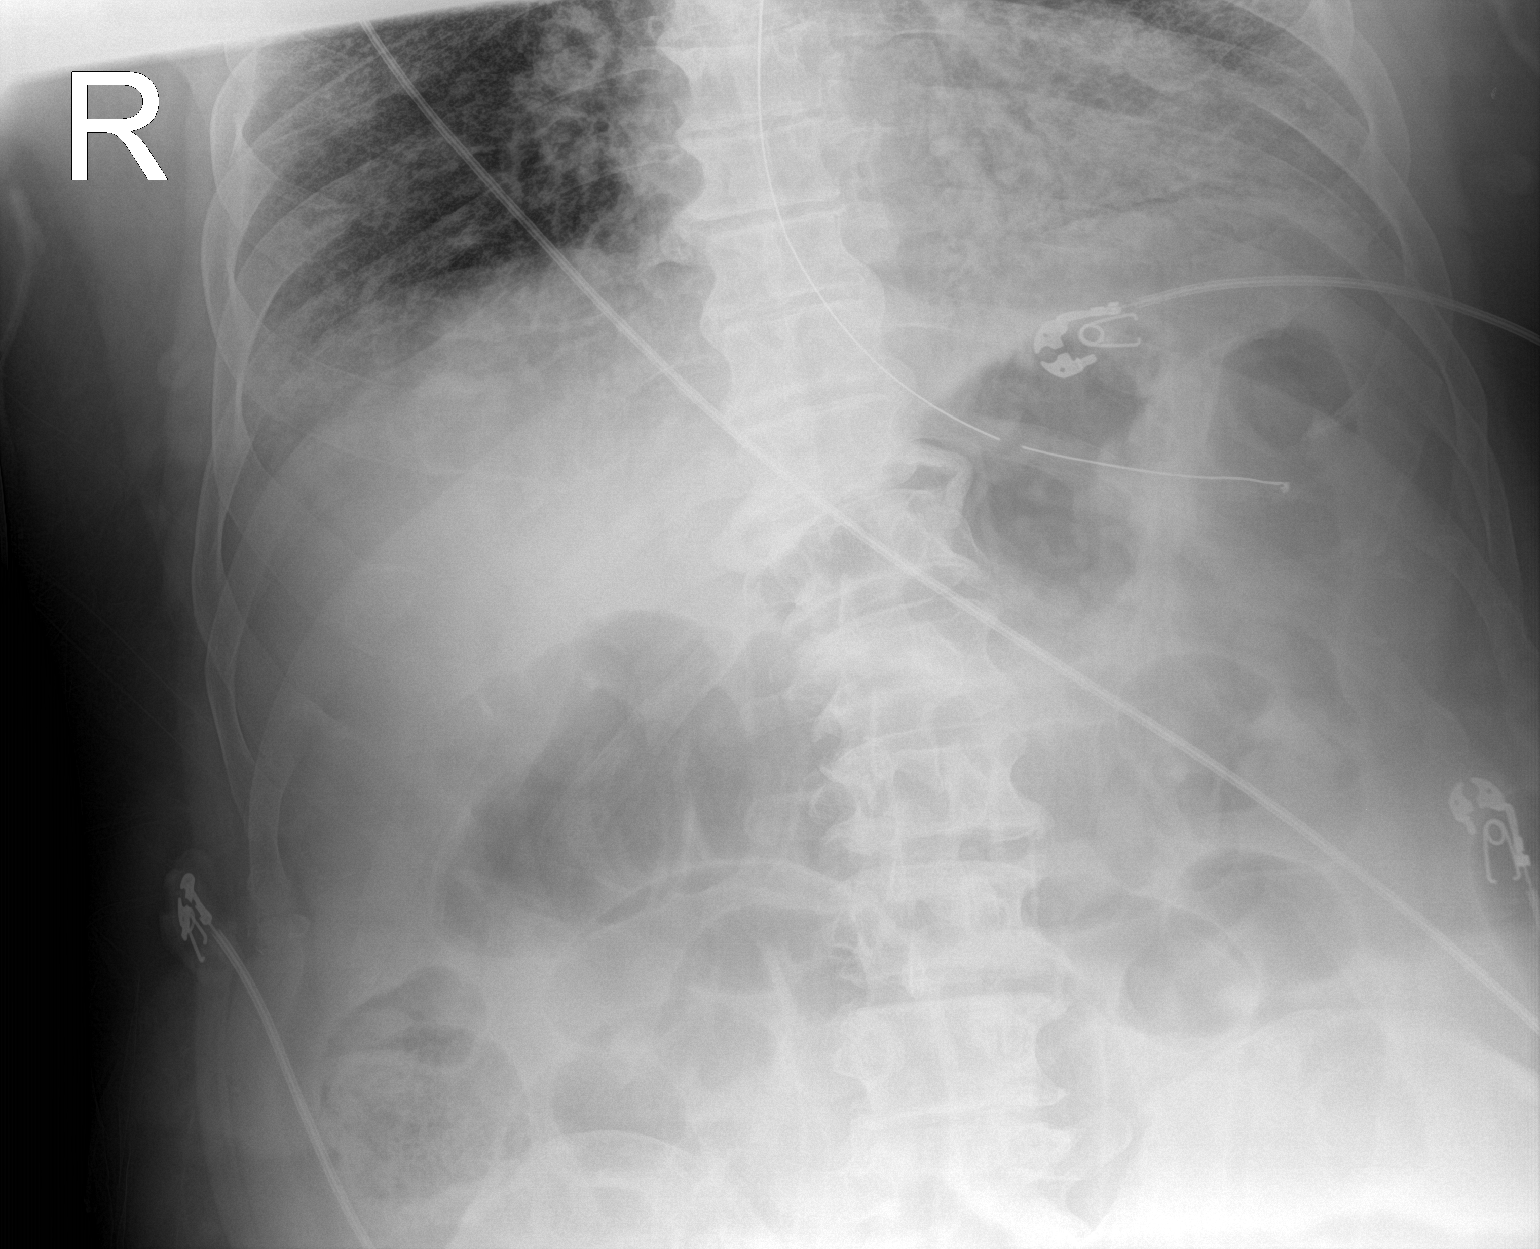

[1 of 1 positions shown; findings below may reference images not displayed]

FINDINGS: Tip and side port of the enteric tube below the diaphragm in the
stomach. Slight gaseous distention of bowel loops in the upper
abdomen. No evidence of obstruction or free air.
IMPRESSION: Tip and side port of the enteric tube below the diaphragm in the
stomach.

## 2021-10-17 IMAGING — DX DG CHEST 1V PORT
1 series · 1 of 1 positions shown · non-contrast
Comparison: 09/17/2019

CLINICAL DATA: Acute respiratory failure due to [id number]

EXAM:
PORTABLE CHEST 1 VIEW

[chest]
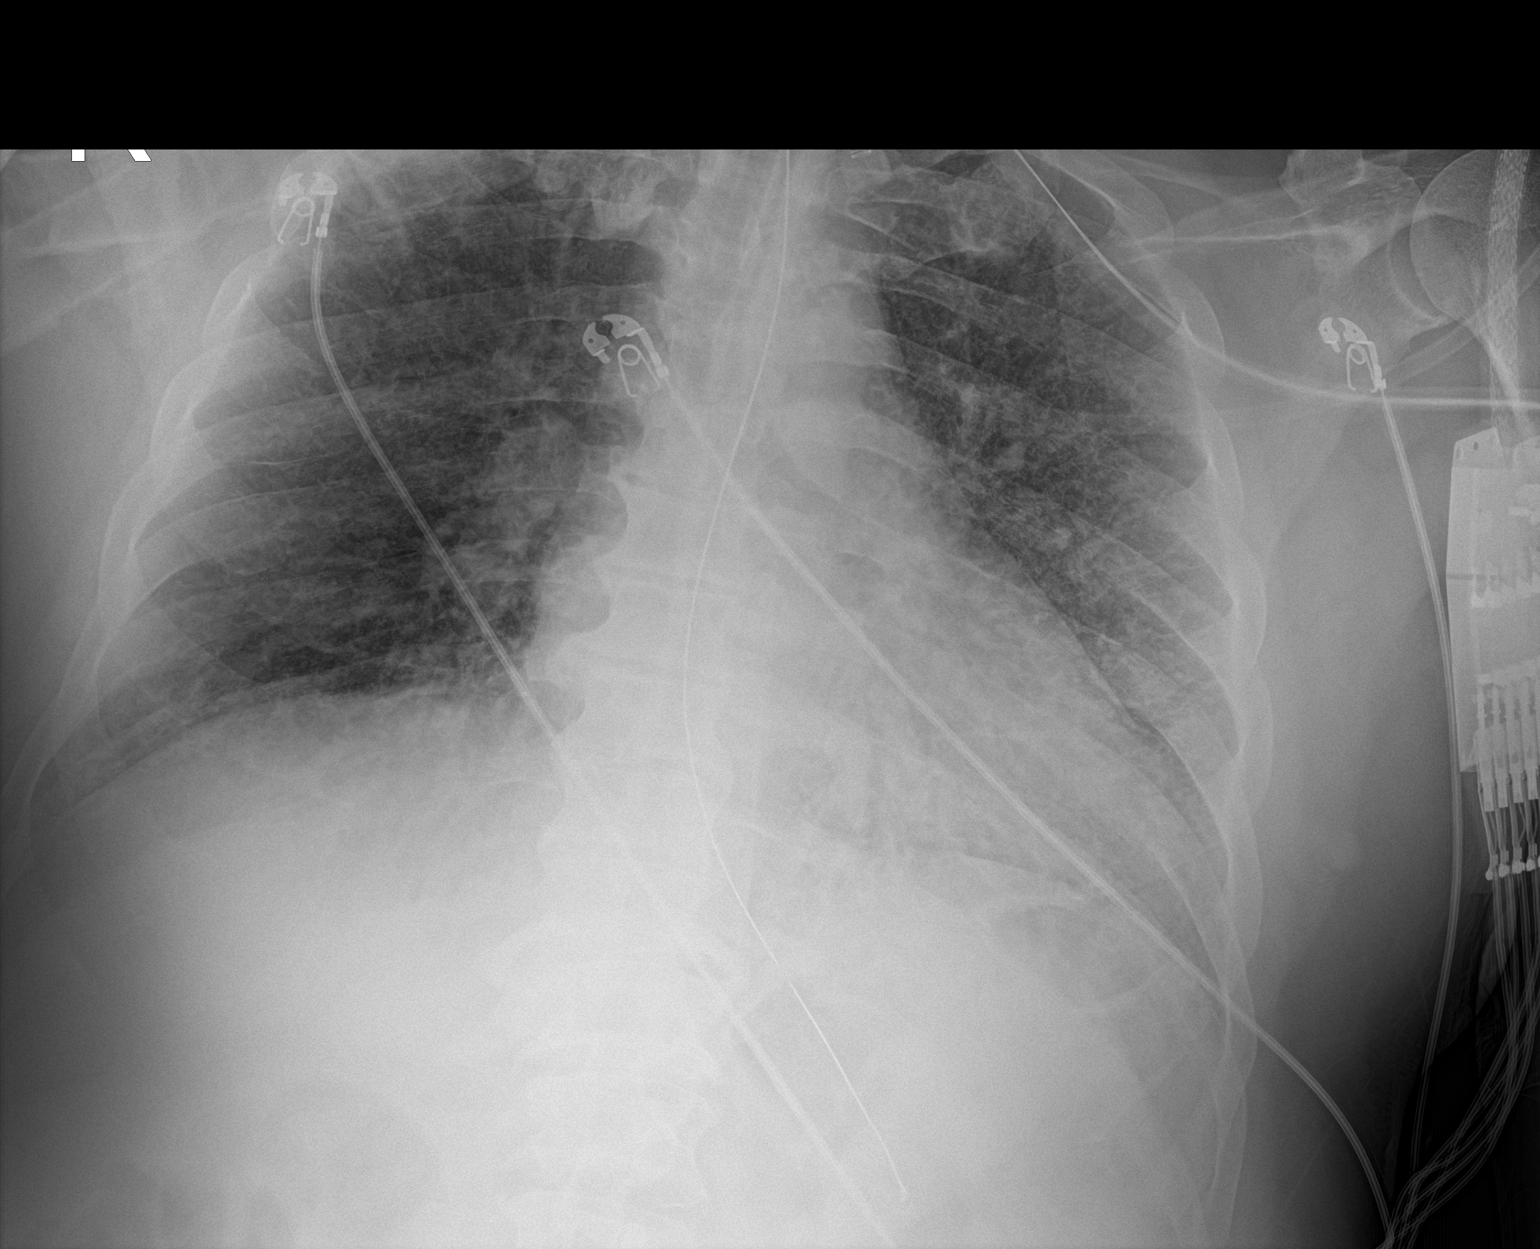

[1 of 1 positions shown; findings below may reference images not displayed]

FINDINGS: Endotracheal tube tip approximately 3.3 cm above the carina. Gastric
tube courses below the diaphragm with the side port below the GE
junction and the tip projecting at the stomach. Slightly improved
aeration lungs with slightly improved left greater than right
interstitial and airspace opacities. No visible pleural effusions or
pneumothorax.
IMPRESSION: 1. Endotracheal tube tip approximately 3.3 cm above the carina.
2. Slightly improved diffuse interstitial airspace opacities,
consistent with multifocal pneumonia in this patient with known
COVID infection.
